# Patient Record
Sex: Male | Born: 1935 | Race: White | Hispanic: No | Marital: Married | Smoking: Former smoker
Health system: Southern US, Community
[De-identification: ages and names within clinical notes are randomized; demographics above are authoritative.]

## PROBLEM LIST (undated history)

## (undated) DIAGNOSIS — Z87442 Personal history of urinary calculi: Secondary | ICD-10-CM

## (undated) DIAGNOSIS — I2510 Atherosclerotic heart disease of native coronary artery without angina pectoris: Secondary | ICD-10-CM

## (undated) DIAGNOSIS — M5126 Other intervertebral disc displacement, lumbar region: Secondary | ICD-10-CM

## (undated) DIAGNOSIS — K649 Unspecified hemorrhoids: Secondary | ICD-10-CM

## (undated) DIAGNOSIS — M48062 Spinal stenosis, lumbar region with neurogenic claudication: Secondary | ICD-10-CM

## (undated) DIAGNOSIS — K219 Gastro-esophageal reflux disease without esophagitis: Secondary | ICD-10-CM

## (undated) DIAGNOSIS — C61 Malignant neoplasm of prostate: Secondary | ICD-10-CM

## (undated) DIAGNOSIS — M5136 Other intervertebral disc degeneration, lumbar region: Secondary | ICD-10-CM

## (undated) DIAGNOSIS — M5416 Radiculopathy, lumbar region: Secondary | ICD-10-CM

## (undated) DIAGNOSIS — M51379 Other intervertebral disc degeneration, lumbosacral region without mention of lumbar back pain or lower extremity pain: Secondary | ICD-10-CM

## (undated) DIAGNOSIS — I10 Essential (primary) hypertension: Secondary | ICD-10-CM

## (undated) DIAGNOSIS — M5137 Other intervertebral disc degeneration, lumbosacral region: Secondary | ICD-10-CM

## (undated) DIAGNOSIS — M1990 Unspecified osteoarthritis, unspecified site: Secondary | ICD-10-CM

## (undated) DIAGNOSIS — C801 Malignant (primary) neoplasm, unspecified: Secondary | ICD-10-CM

## (undated) DIAGNOSIS — E119 Type 2 diabetes mellitus without complications: Secondary | ICD-10-CM

## (undated) DIAGNOSIS — M51369 Other intervertebral disc degeneration, lumbar region without mention of lumbar back pain or lower extremity pain: Secondary | ICD-10-CM

## (undated) DIAGNOSIS — E785 Hyperlipidemia, unspecified: Secondary | ICD-10-CM

## (undated) DIAGNOSIS — J329 Chronic sinusitis, unspecified: Secondary | ICD-10-CM

## (undated) DIAGNOSIS — J449 Chronic obstructive pulmonary disease, unspecified: Secondary | ICD-10-CM

## (undated) DIAGNOSIS — N183 Chronic kidney disease, stage 3 unspecified: Secondary | ICD-10-CM

## (undated) HISTORY — PX: LEG SURGERY: SHX1003

## (undated) HISTORY — PX: CARDIAC CATHETERIZATION: SHX172

## (undated) HISTORY — PX: COLONOSCOPY: SHX174

## (undated) HISTORY — PX: FUNCTIONAL ENDOSCOPIC SINUS SURGERY: SUR616

## (undated) HISTORY — PX: FEMUR FRACTURE SURGERY: SHX633

## (undated) HISTORY — DX: Malignant neoplasm of prostate: C61

---

## 2005-09-23 ENCOUNTER — Ambulatory Visit: Payer: Self-pay | Admitting: Urology

## 2005-09-28 ENCOUNTER — Ambulatory Visit: Payer: Self-pay | Admitting: Urology

## 2005-11-03 ENCOUNTER — Ambulatory Visit: Payer: Self-pay | Admitting: Urology

## 2005-11-03 ENCOUNTER — Other Ambulatory Visit: Payer: Self-pay

## 2005-11-10 ENCOUNTER — Ambulatory Visit: Payer: Self-pay | Admitting: Urology

## 2005-12-14 HISTORY — PX: PROSTATE CRYOABLATION: SUR358

## 2007-12-29 ENCOUNTER — Ambulatory Visit: Payer: Self-pay | Admitting: Physician Assistant

## 2008-01-31 ENCOUNTER — Ambulatory Visit: Payer: Self-pay | Admitting: Unknown Physician Specialty

## 2008-04-11 ENCOUNTER — Emergency Department: Payer: Self-pay | Admitting: Emergency Medicine

## 2008-04-23 ENCOUNTER — Ambulatory Visit: Payer: Self-pay | Admitting: Urology

## 2009-06-20 ENCOUNTER — Ambulatory Visit: Payer: Self-pay

## 2009-08-13 ENCOUNTER — Ambulatory Visit: Payer: Self-pay | Admitting: Pain Medicine

## 2009-08-27 ENCOUNTER — Ambulatory Visit: Payer: Self-pay | Admitting: Physician Assistant

## 2009-09-10 ENCOUNTER — Ambulatory Visit: Payer: Self-pay | Admitting: Pain Medicine

## 2009-09-24 ENCOUNTER — Ambulatory Visit: Payer: Self-pay | Admitting: Pain Medicine

## 2009-10-15 ENCOUNTER — Ambulatory Visit: Payer: Self-pay | Admitting: Physician Assistant

## 2009-11-04 ENCOUNTER — Ambulatory Visit: Payer: Self-pay | Admitting: Urology

## 2010-01-02 ENCOUNTER — Emergency Department: Payer: Self-pay | Admitting: Emergency Medicine

## 2010-12-10 ENCOUNTER — Ambulatory Visit: Payer: Self-pay | Admitting: Urology

## 2012-10-23 ENCOUNTER — Emergency Department: Payer: Self-pay | Admitting: Emergency Medicine

## 2012-10-24 LAB — BASIC METABOLIC PANEL
Anion Gap: 11 (ref 7–16)
BUN: 33 mg/dL — ABNORMAL HIGH (ref 7–18)
Chloride: 105 mmol/L (ref 98–107)
Creatinine: 1.26 mg/dL (ref 0.60–1.30)
EGFR (Non-African Amer.): 56 — ABNORMAL LOW
Glucose: 95 mg/dL (ref 65–99)
Osmolality: 283 (ref 275–301)
Sodium: 138 mmol/L (ref 136–145)

## 2012-12-12 DIAGNOSIS — C61 Malignant neoplasm of prostate: Secondary | ICD-10-CM | POA: Insufficient documentation

## 2012-12-12 DIAGNOSIS — M5430 Sciatica, unspecified side: Secondary | ICD-10-CM | POA: Insufficient documentation

## 2012-12-12 DIAGNOSIS — N509 Disorder of male genital organs, unspecified: Secondary | ICD-10-CM | POA: Insufficient documentation

## 2012-12-12 DIAGNOSIS — N4340 Spermatocele of epididymis, unspecified: Secondary | ICD-10-CM | POA: Insufficient documentation

## 2012-12-12 DIAGNOSIS — N200 Calculus of kidney: Secondary | ICD-10-CM | POA: Insufficient documentation

## 2012-12-28 ENCOUNTER — Ambulatory Visit: Payer: Self-pay | Admitting: Urology

## 2013-07-28 ENCOUNTER — Ambulatory Visit: Payer: Self-pay | Admitting: Unknown Physician Specialty

## 2013-08-04 DIAGNOSIS — N320 Bladder-neck obstruction: Secondary | ICD-10-CM | POA: Insufficient documentation

## 2014-02-14 ENCOUNTER — Ambulatory Visit: Payer: Self-pay | Admitting: Urology

## 2014-06-25 DIAGNOSIS — M545 Low back pain, unspecified: Secondary | ICD-10-CM | POA: Insufficient documentation

## 2014-06-25 DIAGNOSIS — M5136 Other intervertebral disc degeneration, lumbar region: Secondary | ICD-10-CM | POA: Insufficient documentation

## 2014-06-25 DIAGNOSIS — M5126 Other intervertebral disc displacement, lumbar region: Secondary | ICD-10-CM | POA: Insufficient documentation

## 2014-07-09 DIAGNOSIS — M5137 Other intervertebral disc degeneration, lumbosacral region: Secondary | ICD-10-CM | POA: Insufficient documentation

## 2014-07-24 DIAGNOSIS — I2510 Atherosclerotic heart disease of native coronary artery without angina pectoris: Secondary | ICD-10-CM | POA: Insufficient documentation

## 2014-11-01 DIAGNOSIS — J449 Chronic obstructive pulmonary disease, unspecified: Secondary | ICD-10-CM | POA: Insufficient documentation

## 2015-03-27 DIAGNOSIS — I10 Essential (primary) hypertension: Secondary | ICD-10-CM | POA: Insufficient documentation

## 2015-03-27 DIAGNOSIS — E782 Mixed hyperlipidemia: Secondary | ICD-10-CM | POA: Insufficient documentation

## 2015-03-28 ENCOUNTER — Ambulatory Visit
Admit: 2015-03-28 | Disposition: A | Discharge status: Home / Self Care | Payer: Self-pay | Attending: Otolaryngology | Admitting: Otolaryngology

## 2015-04-08 LAB — SURGICAL PATHOLOGY

## 2015-09-12 ENCOUNTER — Other Ambulatory Visit: Payer: Self-pay | Admitting: Physical Medicine and Rehabilitation

## 2015-09-12 DIAGNOSIS — M5416 Radiculopathy, lumbar region: Secondary | ICD-10-CM

## 2015-09-20 ENCOUNTER — Ambulatory Visit
Admission: RE | Admit: 2015-09-20 | Discharge: 2015-09-20 | Disposition: A | Discharge status: Home / Self Care | Payer: PPO | Source: Ambulatory Visit | Attending: Physical Medicine and Rehabilitation | Admitting: Physical Medicine and Rehabilitation

## 2015-09-20 DIAGNOSIS — M4806 Spinal stenosis, lumbar region: Secondary | ICD-10-CM | POA: Diagnosis not present

## 2015-09-20 DIAGNOSIS — M545 Low back pain: Secondary | ICD-10-CM | POA: Diagnosis present

## 2015-09-20 DIAGNOSIS — M5136 Other intervertebral disc degeneration, lumbar region: Secondary | ICD-10-CM | POA: Diagnosis not present

## 2015-09-20 DIAGNOSIS — M2578 Osteophyte, vertebrae: Secondary | ICD-10-CM | POA: Diagnosis not present

## 2015-09-20 DIAGNOSIS — M5416 Radiculopathy, lumbar region: Secondary | ICD-10-CM

## 2015-11-28 DIAGNOSIS — K219 Gastro-esophageal reflux disease without esophagitis: Secondary | ICD-10-CM | POA: Insufficient documentation

## 2016-01-06 DIAGNOSIS — M5416 Radiculopathy, lumbar region: Secondary | ICD-10-CM | POA: Diagnosis not present

## 2016-01-06 DIAGNOSIS — M5136 Other intervertebral disc degeneration, lumbar region: Secondary | ICD-10-CM | POA: Diagnosis not present

## 2016-01-06 DIAGNOSIS — M4806 Spinal stenosis, lumbar region: Secondary | ICD-10-CM | POA: Diagnosis not present

## 2016-01-24 DIAGNOSIS — M5136 Other intervertebral disc degeneration, lumbar region: Secondary | ICD-10-CM | POA: Diagnosis not present

## 2016-01-24 DIAGNOSIS — M5416 Radiculopathy, lumbar region: Secondary | ICD-10-CM | POA: Diagnosis not present

## 2016-01-24 DIAGNOSIS — M4806 Spinal stenosis, lumbar region: Secondary | ICD-10-CM | POA: Diagnosis not present

## 2016-03-09 DIAGNOSIS — M5416 Radiculopathy, lumbar region: Secondary | ICD-10-CM | POA: Diagnosis not present

## 2016-03-09 DIAGNOSIS — M5136 Other intervertebral disc degeneration, lumbar region: Secondary | ICD-10-CM | POA: Diagnosis not present

## 2016-03-09 DIAGNOSIS — M4806 Spinal stenosis, lumbar region: Secondary | ICD-10-CM | POA: Diagnosis not present

## 2016-03-18 DIAGNOSIS — R339 Retention of urine, unspecified: Secondary | ICD-10-CM | POA: Insufficient documentation

## 2016-03-19 DIAGNOSIS — N320 Bladder-neck obstruction: Secondary | ICD-10-CM | POA: Diagnosis not present

## 2016-03-19 DIAGNOSIS — C61 Malignant neoplasm of prostate: Secondary | ICD-10-CM | POA: Diagnosis not present

## 2016-03-19 DIAGNOSIS — R339 Retention of urine, unspecified: Secondary | ICD-10-CM | POA: Diagnosis not present

## 2016-03-19 DIAGNOSIS — N4340 Spermatocele of epididymis, unspecified: Secondary | ICD-10-CM | POA: Diagnosis not present

## 2016-03-26 DIAGNOSIS — K648 Other hemorrhoids: Secondary | ICD-10-CM | POA: Insufficient documentation

## 2016-04-14 DIAGNOSIS — M5416 Radiculopathy, lumbar region: Secondary | ICD-10-CM | POA: Diagnosis not present

## 2016-04-14 DIAGNOSIS — M4806 Spinal stenosis, lumbar region: Secondary | ICD-10-CM | POA: Diagnosis not present

## 2016-04-14 DIAGNOSIS — M5136 Other intervertebral disc degeneration, lumbar region: Secondary | ICD-10-CM | POA: Diagnosis not present

## 2016-04-22 DIAGNOSIS — I2510 Atherosclerotic heart disease of native coronary artery without angina pectoris: Secondary | ICD-10-CM | POA: Diagnosis not present

## 2016-04-22 DIAGNOSIS — K219 Gastro-esophageal reflux disease without esophagitis: Secondary | ICD-10-CM | POA: Diagnosis not present

## 2016-04-22 DIAGNOSIS — E782 Mixed hyperlipidemia: Secondary | ICD-10-CM | POA: Diagnosis not present

## 2016-04-22 DIAGNOSIS — I10 Essential (primary) hypertension: Secondary | ICD-10-CM | POA: Diagnosis not present

## 2016-04-22 DIAGNOSIS — E119 Type 2 diabetes mellitus without complications: Secondary | ICD-10-CM | POA: Diagnosis not present

## 2016-05-19 DIAGNOSIS — H353131 Nonexudative age-related macular degeneration, bilateral, early dry stage: Secondary | ICD-10-CM | POA: Diagnosis not present

## 2016-05-19 DIAGNOSIS — H2513 Age-related nuclear cataract, bilateral: Secondary | ICD-10-CM | POA: Diagnosis not present

## 2016-05-19 DIAGNOSIS — E119 Type 2 diabetes mellitus without complications: Secondary | ICD-10-CM | POA: Diagnosis not present

## 2016-05-21 DIAGNOSIS — I10 Essential (primary) hypertension: Secondary | ICD-10-CM | POA: Diagnosis not present

## 2016-05-21 DIAGNOSIS — Z125 Encounter for screening for malignant neoplasm of prostate: Secondary | ICD-10-CM | POA: Diagnosis not present

## 2016-05-25 DIAGNOSIS — I10 Essential (primary) hypertension: Secondary | ICD-10-CM | POA: Diagnosis not present

## 2016-05-25 DIAGNOSIS — E782 Mixed hyperlipidemia: Secondary | ICD-10-CM | POA: Diagnosis not present

## 2016-05-25 DIAGNOSIS — I2510 Atherosclerotic heart disease of native coronary artery without angina pectoris: Secondary | ICD-10-CM | POA: Diagnosis not present

## 2016-05-25 DIAGNOSIS — E119 Type 2 diabetes mellitus without complications: Secondary | ICD-10-CM | POA: Diagnosis not present

## 2016-05-25 DIAGNOSIS — K219 Gastro-esophageal reflux disease without esophagitis: Secondary | ICD-10-CM | POA: Diagnosis not present

## 2016-05-25 DIAGNOSIS — Z0000 Encounter for general adult medical examination without abnormal findings: Secondary | ICD-10-CM | POA: Diagnosis not present

## 2016-05-28 DIAGNOSIS — M5137 Other intervertebral disc degeneration, lumbosacral region: Secondary | ICD-10-CM | POA: Diagnosis not present

## 2016-05-28 DIAGNOSIS — I10 Essential (primary) hypertension: Secondary | ICD-10-CM | POA: Diagnosis not present

## 2016-05-28 DIAGNOSIS — N183 Chronic kidney disease, stage 3 (moderate): Secondary | ICD-10-CM

## 2016-05-28 DIAGNOSIS — E1122 Type 2 diabetes mellitus with diabetic chronic kidney disease: Secondary | ICD-10-CM | POA: Insufficient documentation

## 2016-05-28 DIAGNOSIS — K219 Gastro-esophageal reflux disease without esophagitis: Secondary | ICD-10-CM | POA: Diagnosis not present

## 2016-06-01 DIAGNOSIS — M5136 Other intervertebral disc degeneration, lumbar region: Secondary | ICD-10-CM | POA: Diagnosis not present

## 2016-06-01 DIAGNOSIS — M4806 Spinal stenosis, lumbar region: Secondary | ICD-10-CM | POA: Diagnosis not present

## 2016-06-01 DIAGNOSIS — M5416 Radiculopathy, lumbar region: Secondary | ICD-10-CM | POA: Diagnosis not present

## 2016-06-26 DIAGNOSIS — M5136 Other intervertebral disc degeneration, lumbar region: Secondary | ICD-10-CM | POA: Diagnosis not present

## 2016-06-26 DIAGNOSIS — M5416 Radiculopathy, lumbar region: Secondary | ICD-10-CM | POA: Diagnosis not present

## 2016-06-26 DIAGNOSIS — M4806 Spinal stenosis, lumbar region: Secondary | ICD-10-CM | POA: Diagnosis not present

## 2016-08-24 DIAGNOSIS — E1122 Type 2 diabetes mellitus with diabetic chronic kidney disease: Secondary | ICD-10-CM | POA: Diagnosis not present

## 2016-08-24 DIAGNOSIS — K219 Gastro-esophageal reflux disease without esophagitis: Secondary | ICD-10-CM | POA: Diagnosis not present

## 2016-08-24 DIAGNOSIS — I2510 Atherosclerotic heart disease of native coronary artery without angina pectoris: Secondary | ICD-10-CM | POA: Diagnosis not present

## 2016-08-24 DIAGNOSIS — R001 Bradycardia, unspecified: Secondary | ICD-10-CM | POA: Diagnosis not present

## 2016-08-24 DIAGNOSIS — E782 Mixed hyperlipidemia: Secondary | ICD-10-CM | POA: Diagnosis not present

## 2016-08-24 DIAGNOSIS — N183 Chronic kidney disease, stage 3 (moderate): Secondary | ICD-10-CM | POA: Diagnosis not present

## 2016-08-24 DIAGNOSIS — I10 Essential (primary) hypertension: Secondary | ICD-10-CM | POA: Diagnosis not present

## 2016-08-26 DIAGNOSIS — N183 Chronic kidney disease, stage 3 (moderate): Secondary | ICD-10-CM | POA: Diagnosis not present

## 2016-08-26 DIAGNOSIS — E1122 Type 2 diabetes mellitus with diabetic chronic kidney disease: Secondary | ICD-10-CM | POA: Diagnosis not present

## 2016-08-27 DIAGNOSIS — M4806 Spinal stenosis, lumbar region: Secondary | ICD-10-CM | POA: Diagnosis not present

## 2016-08-27 DIAGNOSIS — M5416 Radiculopathy, lumbar region: Secondary | ICD-10-CM | POA: Diagnosis not present

## 2016-08-27 DIAGNOSIS — M5136 Other intervertebral disc degeneration, lumbar region: Secondary | ICD-10-CM | POA: Diagnosis not present

## 2016-09-01 DIAGNOSIS — E1122 Type 2 diabetes mellitus with diabetic chronic kidney disease: Secondary | ICD-10-CM | POA: Diagnosis not present

## 2016-09-01 DIAGNOSIS — K219 Gastro-esophageal reflux disease without esophagitis: Secondary | ICD-10-CM | POA: Diagnosis not present

## 2016-09-01 DIAGNOSIS — Z23 Encounter for immunization: Secondary | ICD-10-CM | POA: Diagnosis not present

## 2016-09-01 DIAGNOSIS — N183 Chronic kidney disease, stage 3 (moderate): Secondary | ICD-10-CM | POA: Diagnosis not present

## 2016-09-01 DIAGNOSIS — I10 Essential (primary) hypertension: Secondary | ICD-10-CM | POA: Diagnosis not present

## 2016-09-01 DIAGNOSIS — E782 Mixed hyperlipidemia: Secondary | ICD-10-CM | POA: Diagnosis not present

## 2016-09-21 DIAGNOSIS — E782 Mixed hyperlipidemia: Secondary | ICD-10-CM | POA: Diagnosis not present

## 2016-09-21 DIAGNOSIS — E1122 Type 2 diabetes mellitus with diabetic chronic kidney disease: Secondary | ICD-10-CM | POA: Diagnosis not present

## 2016-09-21 DIAGNOSIS — R5383 Other fatigue: Secondary | ICD-10-CM | POA: Diagnosis not present

## 2016-09-21 DIAGNOSIS — N183 Chronic kidney disease, stage 3 (moderate): Secondary | ICD-10-CM | POA: Diagnosis not present

## 2016-09-21 DIAGNOSIS — I10 Essential (primary) hypertension: Secondary | ICD-10-CM | POA: Diagnosis not present

## 2016-09-21 DIAGNOSIS — I2510 Atherosclerotic heart disease of native coronary artery without angina pectoris: Secondary | ICD-10-CM | POA: Diagnosis not present

## 2016-09-21 DIAGNOSIS — K219 Gastro-esophageal reflux disease without esophagitis: Secondary | ICD-10-CM | POA: Diagnosis not present

## 2016-10-07 DIAGNOSIS — M5136 Other intervertebral disc degeneration, lumbar region: Secondary | ICD-10-CM | POA: Diagnosis not present

## 2016-10-07 DIAGNOSIS — M48062 Spinal stenosis, lumbar region with neurogenic claudication: Secondary | ICD-10-CM | POA: Diagnosis not present

## 2016-10-07 DIAGNOSIS — M5416 Radiculopathy, lumbar region: Secondary | ICD-10-CM | POA: Diagnosis not present

## 2016-10-27 DIAGNOSIS — L578 Other skin changes due to chronic exposure to nonionizing radiation: Secondary | ICD-10-CM | POA: Diagnosis not present

## 2016-10-27 DIAGNOSIS — L814 Other melanin hyperpigmentation: Secondary | ICD-10-CM | POA: Diagnosis not present

## 2016-10-27 DIAGNOSIS — D1800 Hemangioma unspecified site: Secondary | ICD-10-CM | POA: Diagnosis not present

## 2016-10-27 DIAGNOSIS — D229 Melanocytic nevi, unspecified: Secondary | ICD-10-CM | POA: Diagnosis not present

## 2016-10-27 DIAGNOSIS — L821 Other seborrheic keratosis: Secondary | ICD-10-CM | POA: Diagnosis not present

## 2016-10-27 DIAGNOSIS — L820 Inflamed seborrheic keratosis: Secondary | ICD-10-CM | POA: Diagnosis not present

## 2016-10-27 DIAGNOSIS — Z1283 Encounter for screening for malignant neoplasm of skin: Secondary | ICD-10-CM | POA: Diagnosis not present

## 2016-10-27 DIAGNOSIS — L739 Follicular disorder, unspecified: Secondary | ICD-10-CM | POA: Diagnosis not present

## 2016-11-18 DIAGNOSIS — H353131 Nonexudative age-related macular degeneration, bilateral, early dry stage: Secondary | ICD-10-CM | POA: Diagnosis not present

## 2016-11-18 DIAGNOSIS — E119 Type 2 diabetes mellitus without complications: Secondary | ICD-10-CM | POA: Diagnosis not present

## 2016-11-25 DIAGNOSIS — N183 Chronic kidney disease, stage 3 (moderate): Secondary | ICD-10-CM | POA: Diagnosis not present

## 2016-11-25 DIAGNOSIS — E1122 Type 2 diabetes mellitus with diabetic chronic kidney disease: Secondary | ICD-10-CM | POA: Diagnosis not present

## 2016-12-02 DIAGNOSIS — I10 Essential (primary) hypertension: Secondary | ICD-10-CM | POA: Diagnosis not present

## 2016-12-02 DIAGNOSIS — M5137 Other intervertebral disc degeneration, lumbosacral region: Secondary | ICD-10-CM | POA: Diagnosis not present

## 2016-12-02 DIAGNOSIS — K219 Gastro-esophageal reflux disease without esophagitis: Secondary | ICD-10-CM | POA: Diagnosis not present

## 2016-12-02 DIAGNOSIS — N183 Chronic kidney disease, stage 3 (moderate): Secondary | ICD-10-CM | POA: Diagnosis not present

## 2016-12-02 DIAGNOSIS — E1122 Type 2 diabetes mellitus with diabetic chronic kidney disease: Secondary | ICD-10-CM | POA: Diagnosis not present

## 2016-12-02 DIAGNOSIS — E782 Mixed hyperlipidemia: Secondary | ICD-10-CM | POA: Diagnosis not present

## 2016-12-24 DIAGNOSIS — N183 Chronic kidney disease, stage 3 (moderate): Secondary | ICD-10-CM | POA: Diagnosis not present

## 2016-12-24 DIAGNOSIS — I10 Essential (primary) hypertension: Secondary | ICD-10-CM | POA: Diagnosis not present

## 2016-12-24 DIAGNOSIS — J449 Chronic obstructive pulmonary disease, unspecified: Secondary | ICD-10-CM | POA: Diagnosis not present

## 2016-12-24 DIAGNOSIS — J069 Acute upper respiratory infection, unspecified: Secondary | ICD-10-CM | POA: Diagnosis not present

## 2016-12-24 DIAGNOSIS — E1122 Type 2 diabetes mellitus with diabetic chronic kidney disease: Secondary | ICD-10-CM | POA: Diagnosis not present

## 2016-12-25 DIAGNOSIS — M5417 Radiculopathy, lumbosacral region: Secondary | ICD-10-CM | POA: Diagnosis not present

## 2016-12-25 DIAGNOSIS — M5416 Radiculopathy, lumbar region: Secondary | ICD-10-CM | POA: Diagnosis not present

## 2016-12-25 DIAGNOSIS — M48061 Spinal stenosis, lumbar region without neurogenic claudication: Secondary | ICD-10-CM | POA: Diagnosis not present

## 2016-12-25 DIAGNOSIS — M4807 Spinal stenosis, lumbosacral region: Secondary | ICD-10-CM | POA: Diagnosis not present

## 2017-01-05 DIAGNOSIS — H2513 Age-related nuclear cataract, bilateral: Secondary | ICD-10-CM | POA: Diagnosis not present

## 2017-01-06 DIAGNOSIS — J328 Other chronic sinusitis: Secondary | ICD-10-CM | POA: Diagnosis not present

## 2017-01-06 DIAGNOSIS — J330 Polyp of nasal cavity: Secondary | ICD-10-CM | POA: Diagnosis not present

## 2017-01-14 ENCOUNTER — Other Ambulatory Visit: Payer: Self-pay | Admitting: Neurosurgery

## 2017-02-23 NOTE — Pre-Procedure Instructions (Addendum)
Steven Wolfe  02/23/2017      [facility] - [place], [place] - [place] [address] [place] [address] Phone: [phone] Fax: [phone]    Your procedure is scheduled on Thurs. Mar. 22 @ 945.  Report to [facility] Admitting at 745 A.M.  Call this number if you have problems the morning of surgery:  [phone]   Remember:  Do not eat food or drink liquids after midnight.  Take these medicines the morning of surgery with A SIP OF WATER amolodipine (norvasc), omeprazole (prilosec), tramadol (ultram), tetrahydrozolone eye drops.   Do not wear jewelry, make-up or nail polish.  Do not wear lotions, powders, or colognes, or deoderant.  Do not shave 48 hours prior to surgery.  Men may shave face and neck.  Do not bring valuables to the hospital.  [facility] is not responsible for any belongings or valuables.  Contacts, dentures or bridgework may not be worn into surgery.  Leave your suitcase in the car.  After surgery it may be brought to your room.  For patients admitted to the hospital, discharge time will be determined by your treatment team.  Patients discharged the day of surgery will not be allowed to drive home.   Name and phone number of your driver:   Special instructions:     How to Manage Your Diabetes Before and After Surgery  Why is it important to control my blood sugar before and after surgery? . Improving blood sugar levels before and after surgery helps healing and can limit problems. . A way of improving blood sugar control is eating a healthy diet by: o  Eating less sugar and carbohydrates o  Increasing activity/exercise o  Talking with your doctor about reaching your blood sugar goals . High blood sugars (greater than 180 mg/dL) can raise your risk of infections and slow your recovery, so you will need to focus on controlling your diabetes during the weeks before surgery. . Make sure that the doctor who takes care of  your diabetes knows about your planned surgery including the date and location.  How do I manage my blood sugar before surgery? . Check your blood sugar at least 4 times a day, starting 2 days before surgery, to make sure that the level is not too high or low. o Check your blood sugar the morning of your surgery when you wake up and every 2 hours until you get to the Short Stay unit. . If your blood sugar is less than 70 mg/dL, you will need to treat for low blood sugar: o Do not take insulin. o Treat a low blood sugar (less than 70 mg/dL) with  cup of clear juice (cranberry or apple), 4 glucose tablets, OR glucose gel. o Recheck blood sugar in 15 minutes after treatment (to make sure it is greater than 70 mg/dL). If your blood sugar is not greater than 70 mg/dL on recheck, call [phone] for further instructions. . Report your blood sugar to the short stay nurse when you get to Short Stay.  . If you are admitted to the hospital after surgery: o Your blood sugar will be checked by the staff and you will probably be given insulin after surgery (instead of oral diabetes medicines) to make sure you have good blood sugar levels. o The goal for blood sugar control after surgery is 80-180 mg/dL.      WHAT DO I DO  ABOUT MY DIABETES MEDICATION?   Marland Kitchen Do not take oral diabetes medicines (pills) the morning of surgery.   Reviewed and Endorsed by [facility] Patient Education Committee, August 2015  Please read over the following fact sheets that you were given. Pain Booklet, Coughing and Deep Breathing, MRSA Information and Surgical Site Infection Prevention

## 2017-02-24 ENCOUNTER — Encounter (HOSPITAL_COMMUNITY)
Admission: RE | Admit: 2017-02-24 | Discharge: 2017-02-24 | Disposition: A | Discharge status: Home / Self Care | Payer: PPO | Source: Ambulatory Visit | Attending: Neurosurgery | Admitting: Neurosurgery

## 2017-02-24 ENCOUNTER — Encounter (HOSPITAL_COMMUNITY): Payer: Self-pay

## 2017-02-24 DIAGNOSIS — M48062 Spinal stenosis, lumbar region with neurogenic claudication: Secondary | ICD-10-CM | POA: Diagnosis not present

## 2017-02-24 DIAGNOSIS — N183 Chronic kidney disease, stage 3 (moderate): Secondary | ICD-10-CM | POA: Insufficient documentation

## 2017-02-24 DIAGNOSIS — I2510 Atherosclerotic heart disease of native coronary artery without angina pectoris: Secondary | ICD-10-CM | POA: Insufficient documentation

## 2017-02-24 DIAGNOSIS — E1122 Type 2 diabetes mellitus with diabetic chronic kidney disease: Secondary | ICD-10-CM | POA: Insufficient documentation

## 2017-02-24 DIAGNOSIS — M5416 Radiculopathy, lumbar region: Secondary | ICD-10-CM | POA: Diagnosis not present

## 2017-02-24 DIAGNOSIS — I129 Hypertensive chronic kidney disease with stage 1 through stage 4 chronic kidney disease, or unspecified chronic kidney disease: Secondary | ICD-10-CM | POA: Diagnosis not present

## 2017-02-24 DIAGNOSIS — Z79899 Other long term (current) drug therapy: Secondary | ICD-10-CM | POA: Insufficient documentation

## 2017-02-24 DIAGNOSIS — C801 Malignant (primary) neoplasm, unspecified: Secondary | ICD-10-CM

## 2017-02-24 DIAGNOSIS — M4690 Unspecified inflammatory spondylopathy, site unspecified: Secondary | ICD-10-CM | POA: Diagnosis not present

## 2017-02-24 DIAGNOSIS — Z01818 Encounter for other preprocedural examination: Secondary | ICD-10-CM | POA: Diagnosis not present

## 2017-02-24 DIAGNOSIS — E119 Type 2 diabetes mellitus without complications: Secondary | ICD-10-CM | POA: Diagnosis not present

## 2017-02-24 DIAGNOSIS — Z7984 Long term (current) use of oral hypoglycemic drugs: Secondary | ICD-10-CM | POA: Insufficient documentation

## 2017-02-24 DIAGNOSIS — M4807 Spinal stenosis, lumbosacral region: Secondary | ICD-10-CM | POA: Diagnosis not present

## 2017-02-24 DIAGNOSIS — Z888 Allergy status to other drugs, medicaments and biological substances status: Secondary | ICD-10-CM | POA: Diagnosis not present

## 2017-02-24 DIAGNOSIS — Z87891 Personal history of nicotine dependence: Secondary | ICD-10-CM | POA: Diagnosis not present

## 2017-02-24 DIAGNOSIS — M48061 Spinal stenosis, lumbar region without neurogenic claudication: Secondary | ICD-10-CM | POA: Diagnosis not present

## 2017-02-24 DIAGNOSIS — M549 Dorsalgia, unspecified: Secondary | ICD-10-CM | POA: Diagnosis not present

## 2017-02-24 DIAGNOSIS — Z8546 Personal history of malignant neoplasm of prostate: Secondary | ICD-10-CM | POA: Diagnosis not present

## 2017-02-24 DIAGNOSIS — Z0183 Encounter for blood typing: Secondary | ICD-10-CM | POA: Diagnosis not present

## 2017-02-24 DIAGNOSIS — Z9049 Acquired absence of other specified parts of digestive tract: Secondary | ICD-10-CM | POA: Diagnosis not present

## 2017-02-24 DIAGNOSIS — Z01812 Encounter for preprocedural laboratory examination: Secondary | ICD-10-CM | POA: Diagnosis not present

## 2017-02-24 DIAGNOSIS — M5116 Intervertebral disc disorders with radiculopathy, lumbar region: Secondary | ICD-10-CM | POA: Diagnosis not present

## 2017-02-24 DIAGNOSIS — Z91030 Bee allergy status: Secondary | ICD-10-CM | POA: Diagnosis not present

## 2017-02-24 DIAGNOSIS — K219 Gastro-esophageal reflux disease without esophagitis: Secondary | ICD-10-CM | POA: Diagnosis not present

## 2017-02-24 DIAGNOSIS — E782 Mixed hyperlipidemia: Secondary | ICD-10-CM | POA: Diagnosis not present

## 2017-02-24 DIAGNOSIS — Z981 Arthrodesis status: Secondary | ICD-10-CM | POA: Diagnosis not present

## 2017-02-24 DIAGNOSIS — I10 Essential (primary) hypertension: Secondary | ICD-10-CM | POA: Diagnosis not present

## 2017-02-24 DIAGNOSIS — M5136 Other intervertebral disc degeneration, lumbar region: Secondary | ICD-10-CM | POA: Diagnosis not present

## 2017-02-24 HISTORY — DX: Atherosclerotic heart disease of native coronary artery without angina pectoris: I25.10

## 2017-02-24 HISTORY — DX: Personal history of urinary calculi: Z87.442

## 2017-02-24 HISTORY — PX: CHOLECYSTECTOMY: SHX55

## 2017-02-24 HISTORY — DX: Malignant (primary) neoplasm, unspecified: C80.1

## 2017-02-24 HISTORY — DX: Essential (primary) hypertension: I10

## 2017-02-24 HISTORY — DX: Type 2 diabetes mellitus without complications: E11.9

## 2017-02-24 HISTORY — DX: Unspecified osteoarthritis, unspecified site: M19.90

## 2017-02-24 LAB — HEMOGLOBIN A1C
Hgb A1c MFr Bld: 6.5 % — ABNORMAL HIGH (ref 4.8–5.6)
Mean Plasma Glucose: 140 mg/dL

## 2017-02-24 LAB — CBC
HEMATOCRIT: 38.9 % — AB (ref 39.0–52.0)
Hemoglobin: 13 g/dL (ref 13.0–17.0)
MCH: 30.1 pg (ref 26.0–34.0)
MCHC: 33.4 g/dL (ref 30.0–36.0)
MCV: 90 fL (ref 78.0–100.0)
PLATELETS: 251 10*3/uL (ref 150–400)
RBC: 4.32 MIL/uL (ref 4.22–5.81)
RDW: 12.4 % (ref 11.5–15.5)
WBC: 5.9 10*3/uL (ref 4.0–10.5)

## 2017-02-24 LAB — BASIC METABOLIC PANEL
Anion gap: 10 (ref 5–15)
BUN: 25 mg/dL — AB (ref 6–20)
CHLORIDE: 108 mmol/L (ref 101–111)
CO2: 24 mmol/L (ref 22–32)
CREATININE: 1.26 mg/dL — AB (ref 0.61–1.24)
Calcium: 9.2 mg/dL (ref 8.9–10.3)
GFR calc Af Amer: >60 mL/min (ref >60)
GFR calc non Af Amer: 53 mL/min — ABNORMAL LOW (ref >60)
Glucose, Bld: 146 mg/dL — ABNORMAL HIGH (ref 65–99)
POTASSIUM: 4.1 mmol/L (ref 3.5–5.1)
Sodium: 142 mmol/L (ref 135–145)

## 2017-02-24 LAB — TYPE AND SCREEN
ABO/RH(D): A POS
ANTIBODY SCREEN: NEGATIVE

## 2017-02-24 LAB — ABO/RH: ABO/RH(D): A POS

## 2017-02-24 LAB — SURGICAL PCR SCREEN
MRSA, PCR: NEGATIVE
Staphylococcus aureus: NEGATIVE

## 2017-02-24 LAB — GLUCOSE, CAPILLARY: GLUCOSE-CAPILLARY: 146 mg/dL — AB (ref 65–99)

## 2017-02-24 MED ORDER — CHLORHEXIDINE GLUCONATE CLOTH 2 % EX PADS: 6.0000 | MEDICATED_PAD | Freq: Once | CUTANEOUS | Status: DC

## 2017-02-24 NOTE — Progress Notes (Addendum)
PCP: Dr. Glendon Axe  Cardiologist: Dr. Serafina Royals  EKG: past denies  Stress Test: pt reports possibly 4-5 years ago @ Dr. Nehemiah Massed  ECHO: pt reports possibly 4-5 years ago  Cardiac Cath: pt reports 15 years ago  Chest X-ray: pt denies past year

## 2017-02-26 NOTE — Progress Notes (Addendum)
Anesthesia Chart Review:  Pt is a 81 year old male scheduled for L4-5, L5-S1 PLIF, posterior lateral arthrodesis, posterior segmental instrumentation, interbody prosthesis on 03/04/2017 with Newman Pies, M.D.  - PCP is Glendon Axe, MD (notes in care everywhere) - Cardiologist is Serafina Royals, MD; last office visit 09/21/16 with Etta Quill, NP (notes in care everywhere)  PMH includes: CAD (mild by cath 11/26/00), HTN, DM, CKD (stage 3), prostate cancer. Former smoker.  Medications include: Amlodipine, gemfibrozil, glimepiride, Prilosec, ramipril  Preoperative labs reviewed.  HbA1c 6.5, glucose 146.  EKG 02/24/17: Sinus rhythm with 1st degree A-V block with PACs in a pattern of bigeminy. RBBB. Appears stable when compared to EKG 05/25/16 from cardiologist's office.   Echo 01/19/12 (current notable clinic): 1. Normal LV systolic function. EF 50%. 2. Moderate mitral and tricuspid insufficiency.  Cardiac cath 11/26/00 [facility]): 1. LM: Normal 2. LAD: Mid 50% tubular 3. CX: Small distal CX, small OM 2 and OM 3, large OM1, large ramus intermediate. 4. RCA: Acute marginal small. RPL 1 small. RPL 3 large.  Cardiology note by Etta Quill, NP dated 05/25/16 notes that if pt were to need back surgery, he would need to have a stress echo for pre-op eval.  This has not been done.  I notified Nikki in Dr. Arnoldo Morale office pt would need cardiac clearance prior to surgery.   Willeen Cass, FNP-BC [facility]/Anesthesiology Phone: [phone] 02/26/2017 2:55 PM  Addendum:   Pt saw Etta Quill, NP with cardiology 03/02/17.  Underwent Exercise Treadmill Test. Ms. Samara Snide note states "He exercised for 3 minutes on a Bruce protocol with resting heart rate of 86 bpm going to 142 bpm, representing 100% of the maximal age-predicted heart rate, with no significant EKG changes noted. The test was stopped due to fatigue" and pt was cleared for surgery at low risk.   If no  changes, I anticipate pt can proceed with surgery as scheduled.   Willeen Cass, FNP-BC [facility]/Anesthesiology Phone: [phone] 03/03/2017 2:19 PM

## 2017-03-02 DIAGNOSIS — I10 Essential (primary) hypertension: Secondary | ICD-10-CM | POA: Diagnosis not present

## 2017-03-02 DIAGNOSIS — I2510 Atherosclerotic heart disease of native coronary artery without angina pectoris: Secondary | ICD-10-CM | POA: Diagnosis not present

## 2017-03-02 DIAGNOSIS — E782 Mixed hyperlipidemia: Secondary | ICD-10-CM | POA: Diagnosis not present

## 2017-03-02 DIAGNOSIS — E119 Type 2 diabetes mellitus without complications: Secondary | ICD-10-CM | POA: Diagnosis not present

## 2017-03-02 DIAGNOSIS — K219 Gastro-esophageal reflux disease without esophagitis: Secondary | ICD-10-CM | POA: Diagnosis not present

## 2017-03-04 ENCOUNTER — Inpatient Hospital Stay (HOSPITAL_COMMUNITY)
Admission: RE | Admit: 2017-03-04 | Discharge: 2017-03-06 | DRG: 455 | Disposition: A | Discharge status: Home / Self Care | Payer: PPO | Source: Ambulatory Visit | Attending: Neurosurgery | Admitting: Neurosurgery

## 2017-03-04 ENCOUNTER — Inpatient Hospital Stay (HOSPITAL_COMMUNITY): Payer: PPO | Admitting: Anesthesiology

## 2017-03-04 ENCOUNTER — Inpatient Hospital Stay (HOSPITAL_COMMUNITY): Payer: PPO

## 2017-03-04 ENCOUNTER — Inpatient Hospital Stay (HOSPITAL_COMMUNITY): Payer: PPO | Admitting: Vascular Surgery

## 2017-03-04 ENCOUNTER — Encounter (HOSPITAL_COMMUNITY): Payer: Self-pay | Admitting: Urology

## 2017-03-04 ENCOUNTER — Encounter (HOSPITAL_COMMUNITY)
Admission: RE | Disposition: A | Discharge status: Home / Self Care | Payer: Self-pay | Source: Ambulatory Visit | Attending: Neurosurgery

## 2017-03-04 DIAGNOSIS — M5116 Intervertebral disc disorders with radiculopathy, lumbar region: Secondary | ICD-10-CM | POA: Diagnosis not present

## 2017-03-04 DIAGNOSIS — M5136 Other intervertebral disc degeneration, lumbar region: Secondary | ICD-10-CM | POA: Diagnosis not present

## 2017-03-04 DIAGNOSIS — M48061 Spinal stenosis, lumbar region without neurogenic claudication: Secondary | ICD-10-CM | POA: Diagnosis not present

## 2017-03-04 DIAGNOSIS — M48062 Spinal stenosis, lumbar region with neurogenic claudication: Principal | ICD-10-CM | POA: Diagnosis present

## 2017-03-04 DIAGNOSIS — Z8546 Personal history of malignant neoplasm of prostate: Secondary | ICD-10-CM | POA: Diagnosis not present

## 2017-03-04 DIAGNOSIS — Z888 Allergy status to other drugs, medicaments and biological substances status: Secondary | ICD-10-CM | POA: Diagnosis not present

## 2017-03-04 DIAGNOSIS — I10 Essential (primary) hypertension: Secondary | ICD-10-CM | POA: Diagnosis not present

## 2017-03-04 DIAGNOSIS — M4807 Spinal stenosis, lumbosacral region: Secondary | ICD-10-CM | POA: Diagnosis not present

## 2017-03-04 DIAGNOSIS — M4690 Unspecified inflammatory spondylopathy, site unspecified: Secondary | ICD-10-CM | POA: Diagnosis not present

## 2017-03-04 DIAGNOSIS — M549 Dorsalgia, unspecified: Secondary | ICD-10-CM | POA: Diagnosis not present

## 2017-03-04 DIAGNOSIS — Z981 Arthrodesis status: Secondary | ICD-10-CM | POA: Diagnosis not present

## 2017-03-04 DIAGNOSIS — E119 Type 2 diabetes mellitus without complications: Secondary | ICD-10-CM | POA: Diagnosis not present

## 2017-03-04 DIAGNOSIS — Z419 Encounter for procedure for purposes other than remedying health state, unspecified: Secondary | ICD-10-CM

## 2017-03-04 DIAGNOSIS — M5416 Radiculopathy, lumbar region: Secondary | ICD-10-CM | POA: Diagnosis not present

## 2017-03-04 DIAGNOSIS — Z87891 Personal history of nicotine dependence: Secondary | ICD-10-CM | POA: Diagnosis not present

## 2017-03-04 DIAGNOSIS — I2510 Atherosclerotic heart disease of native coronary artery without angina pectoris: Secondary | ICD-10-CM | POA: Diagnosis not present

## 2017-03-04 DIAGNOSIS — Z9049 Acquired absence of other specified parts of digestive tract: Secondary | ICD-10-CM | POA: Diagnosis not present

## 2017-03-04 DIAGNOSIS — Z91030 Bee allergy status: Secondary | ICD-10-CM

## 2017-03-04 LAB — GLUCOSE, CAPILLARY
GLUCOSE-CAPILLARY: 129 mg/dL — AB (ref 65–99)
GLUCOSE-CAPILLARY: 130 mg/dL — AB (ref 65–99)
Glucose-Capillary: 122 mg/dL — ABNORMAL HIGH (ref 65–99)
Glucose-Capillary: 142 mg/dL — ABNORMAL HIGH (ref 65–99)

## 2017-03-04 SURGERY — POSTERIOR LUMBAR FUSION 2 LEVEL
Anesthesia: General | Site: Back

## 2017-03-04 MED ORDER — SODIUM CHLORIDE 0.9 % IV SOLN: 250.0000 mL | INTRAVENOUS | Status: DC

## 2017-03-04 MED ORDER — BISACODYL 10 MG RE SUPP: 10.0000 mg | Freq: Every day | RECTAL | Status: DC | PRN

## 2017-03-04 MED ORDER — THROMBIN 20000 UNITS EX SOLR
CUTANEOUS | Status: AC
  Filled 2017-03-04: qty 20000

## 2017-03-04 MED ORDER — MENTHOL 3 MG MT LOZG: 1.0000 | LOZENGE | OROMUCOSAL | Status: DC | PRN

## 2017-03-04 MED ORDER — LIDOCAINE HCL (CARDIAC) 20 MG/ML IV SOLN
INTRAVENOUS | Status: DC | PRN
  Administered 2017-03-04: 80 mg via INTRAVENOUS

## 2017-03-04 MED ORDER — THROMBIN 5000 UNITS EX SOLR
CUTANEOUS | Status: AC
  Filled 2017-03-04: qty 5000

## 2017-03-04 MED ORDER — SODIUM CHLORIDE 0.9% FLUSH: 3.0000 mL | Freq: Two times a day (BID) | INTRAVENOUS | Status: DC

## 2017-03-04 MED ORDER — MORPHINE SULFATE (PF) 4 MG/ML IV SOLN: 4.0000 mg | INTRAVENOUS | Status: DC | PRN

## 2017-03-04 MED ORDER — PANTOPRAZOLE SODIUM 40 MG PO TBEC
40.0000 mg | DELAYED_RELEASE_TABLET | Freq: Every day | ORAL | Status: DC
  Administered 2017-03-05: 40 mg via ORAL
  Filled 2017-03-04: qty 1

## 2017-03-04 MED ORDER — DOCUSATE SODIUM 100 MG PO CAPS
100.0000 mg | ORAL_CAPSULE | Freq: Two times a day (BID) | ORAL | Status: DC
  Administered 2017-03-04 – 2017-03-05 (×3): 100 mg via ORAL
  Filled 2017-03-04 (×3): qty 1

## 2017-03-04 MED ORDER — AMLODIPINE BESYLATE 5 MG PO TABS
5.0000 mg | ORAL_TABLET | Freq: Two times a day (BID) | ORAL | Status: DC
  Administered 2017-03-04 – 2017-03-05 (×3): 5 mg via ORAL
  Filled 2017-03-04 (×4): qty 1

## 2017-03-04 MED ORDER — ADULT MULTIVITAMIN W/MINERALS CH
1.0000 | ORAL_TABLET | Freq: Every day | ORAL | Status: DC
  Administered 2017-03-05: 1 via ORAL
  Filled 2017-03-04: qty 1

## 2017-03-04 MED ORDER — SODIUM CHLORIDE 0.9 % IR SOLN
Status: DC | PRN
  Administered 2017-03-04: 11:00:00

## 2017-03-04 MED ORDER — BUPIVACAINE LIPOSOME 1.3 % IJ SUSP
20.0000 mL | INTRAMUSCULAR | Status: DC
  Filled 2017-03-04: qty 20

## 2017-03-04 MED ORDER — PROPOFOL 10 MG/ML IV BOLUS
INTRAVENOUS | Status: AC
  Filled 2017-03-04: qty 20

## 2017-03-04 MED ORDER — HYDROMORPHONE HCL 1 MG/ML IJ SOLN
INTRAMUSCULAR | Status: AC
  Filled 2017-03-04: qty 0.5

## 2017-03-04 MED ORDER — ACETAMINOPHEN 650 MG RE SUPP: 650.0000 mg | RECTAL | Status: DC | PRN

## 2017-03-04 MED ORDER — ACETAMINOPHEN 325 MG PO TABS
650.0000 mg | ORAL_TABLET | ORAL | Status: DC | PRN
  Administered 2017-03-05: 650 mg via ORAL
  Filled 2017-03-04: qty 2

## 2017-03-04 MED ORDER — PHENYLEPHRINE HCL 10 MG/ML IJ SOLN
INTRAVENOUS | Status: DC | PRN
  Administered 2017-03-04: 20 ug/min via INTRAVENOUS

## 2017-03-04 MED ORDER — ROCURONIUM BROMIDE 100 MG/10ML IV SOLN
INTRAVENOUS | Status: DC | PRN
Start: 2017-03-04 — End: 2017-03-04
  Administered 2017-03-04: 50 mg via INTRAVENOUS

## 2017-03-04 MED ORDER — HYDROMORPHONE HCL 1 MG/ML IJ SOLN
INTRAMUSCULAR | Status: AC
  Administered 2017-03-04: 0.5 mg via INTRAVENOUS
  Filled 2017-03-04: qty 1

## 2017-03-04 MED ORDER — ONDANSETRON HCL 4 MG/2ML IJ SOLN
INTRAMUSCULAR | Status: DC | PRN
  Administered 2017-03-04: 4 mg via INTRAVENOUS

## 2017-03-04 MED ORDER — HYDROMORPHONE HCL 1 MG/ML IJ SOLN
0.2500 mg | INTRAMUSCULAR | Status: DC | PRN
  Administered 2017-03-04 (×4): 0.5 mg via INTRAVENOUS

## 2017-03-04 MED ORDER — CYCLOBENZAPRINE HCL 10 MG PO TABS
ORAL_TABLET | ORAL | Status: AC
  Filled 2017-03-04: qty 1

## 2017-03-04 MED ORDER — VANCOMYCIN HCL 1000 MG IV SOLR
INTRAVENOUS | Status: AC
Start: 2017-03-04 — End: 2017-03-04
  Filled 2017-03-04: qty 1000

## 2017-03-04 MED ORDER — GLYCOPYRROLATE 0.2 MG/ML IJ SOLN
INTRAMUSCULAR | Status: DC | PRN
  Administered 2017-03-04: 0.2 mg via INTRAVENOUS

## 2017-03-04 MED ORDER — ONDANSETRON HCL 4 MG PO TABS: 4.0000 mg | ORAL_TABLET | Freq: Four times a day (QID) | ORAL | Status: DC | PRN

## 2017-03-04 MED ORDER — INSULIN ASPART 100 UNIT/ML ~~LOC~~ SOLN: 0.0000 [IU] | Freq: Three times a day (TID) | SUBCUTANEOUS | Status: DC

## 2017-03-04 MED ORDER — GELATIN ABSORBABLE MT POWD
OROMUCOSAL | Status: DC | PRN
  Administered 2017-03-04: 12:00:00 via TOPICAL

## 2017-03-04 MED ORDER — TRAMADOL HCL 50 MG PO TABS
100.0000 mg | ORAL_TABLET | Freq: Four times a day (QID) | ORAL | Status: DC
  Administered 2017-03-04 – 2017-03-06 (×6): 100 mg via ORAL
  Filled 2017-03-04 (×7): qty 2

## 2017-03-04 MED ORDER — BACITRACIN ZINC 500 UNIT/GM EX OINT
TOPICAL_OINTMENT | CUTANEOUS | Status: AC
  Filled 2017-03-04: qty 28.35

## 2017-03-04 MED ORDER — PROPOFOL 10 MG/ML IV BOLUS
INTRAVENOUS | Status: DC | PRN
  Administered 2017-03-04: 150 mg via INTRAVENOUS

## 2017-03-04 MED ORDER — SODIUM CHLORIDE 0.9% FLUSH: 3.0000 mL | INTRAVENOUS | Status: DC | PRN

## 2017-03-04 MED ORDER — LACTATED RINGERS IV SOLN
INTRAVENOUS | Status: DC
  Administered 2017-03-04 (×3): via INTRAVENOUS

## 2017-03-04 MED ORDER — FENTANYL CITRATE (PF) 100 MCG/2ML IJ SOLN
INTRAMUSCULAR | Status: DC | PRN
  Administered 2017-03-04 (×4): 50 ug via INTRAVENOUS
  Administered 2017-03-04: 100 ug via INTRAVENOUS

## 2017-03-04 MED ORDER — BUPIVACAINE-EPINEPHRINE (PF) 0.5% -1:200000 IJ SOLN
INTRAMUSCULAR | Status: DC | PRN
  Administered 2017-03-04: 10 mL

## 2017-03-04 MED ORDER — OXYCODONE HCL 5 MG PO TABS
ORAL_TABLET | ORAL | Status: AC
  Filled 2017-03-04: qty 2

## 2017-03-04 MED ORDER — PHENYLEPHRINE HCL 10 MG/ML IJ SOLN
INTRAMUSCULAR | Status: DC | PRN
Start: 2017-03-04 — End: 2017-03-04
  Administered 2017-03-04: 160 ug via INTRAVENOUS
  Administered 2017-03-04 (×2): 80 ug via INTRAVENOUS

## 2017-03-04 MED ORDER — POLYVINYL ALCOHOL 1.4 % OP SOLN
1.0000 [drp] | Freq: Four times a day (QID) | OPHTHALMIC | Status: DC | PRN
  Filled 2017-03-04: qty 15

## 2017-03-04 MED ORDER — RAMIPRIL 10 MG PO CAPS
10.0000 mg | ORAL_CAPSULE | Freq: Two times a day (BID) | ORAL | Status: DC
  Administered 2017-03-04 – 2017-03-05 (×3): 10 mg via ORAL
  Filled 2017-03-04 (×4): qty 1

## 2017-03-04 MED ORDER — CEFAZOLIN SODIUM-DEXTROSE 2-4 GM/100ML-% IV SOLN
2.0000 g | Freq: Three times a day (TID) | INTRAVENOUS | Status: AC
  Administered 2017-03-04 – 2017-03-05 (×2): 2 g via INTRAVENOUS
  Filled 2017-03-04 (×2): qty 100

## 2017-03-04 MED ORDER — PHENOL 1.4 % MT LIQD: 1.0000 | OROMUCOSAL | Status: DC | PRN

## 2017-03-04 MED ORDER — BUPIVACAINE LIPOSOME 1.3 % IJ SUSP
INTRAMUSCULAR | Status: DC | PRN
  Administered 2017-03-04: 20 mL

## 2017-03-04 MED ORDER — FENTANYL CITRATE (PF) 100 MCG/2ML IJ SOLN
INTRAMUSCULAR | Status: AC
  Filled 2017-03-04: qty 2

## 2017-03-04 MED ORDER — BACITRACIN ZINC 500 UNIT/GM EX OINT
TOPICAL_OINTMENT | CUTANEOUS | Status: DC | PRN
  Administered 2017-03-04: 1 via TOPICAL

## 2017-03-04 MED ORDER — SUGAMMADEX SODIUM 200 MG/2ML IV SOLN
INTRAVENOUS | Status: DC | PRN
  Administered 2017-03-04: 150 mg via INTRAVENOUS

## 2017-03-04 MED ORDER — VANCOMYCIN HCL 1000 MG IV SOLR
INTRAVENOUS | Status: DC | PRN
  Administered 2017-03-04: 1000 mg via TOPICAL

## 2017-03-04 MED ORDER — GLIMEPIRIDE 1 MG PO TABS
0.5000 mg | ORAL_TABLET | Freq: Every day | ORAL | Status: DC
  Administered 2017-03-05: 0.5 mg via ORAL
  Filled 2017-03-04 (×2): qty 0.5

## 2017-03-04 MED ORDER — GEMFIBROZIL 600 MG PO TABS
600.0000 mg | ORAL_TABLET | Freq: Two times a day (BID) | ORAL | Status: DC
  Administered 2017-03-04 – 2017-03-05 (×3): 600 mg via ORAL
  Filled 2017-03-04 (×4): qty 1

## 2017-03-04 MED ORDER — HYDROMORPHONE HCL 1 MG/ML IJ SOLN
INTRAMUSCULAR | Status: AC
  Administered 2017-03-04: 0.5 mg via INTRAVENOUS
  Filled 2017-03-04: qty 0.5

## 2017-03-04 MED ORDER — CEFAZOLIN SODIUM-DEXTROSE 2-4 GM/100ML-% IV SOLN
2.0000 g | INTRAVENOUS | Status: AC
  Administered 2017-03-04: 2 g via INTRAVENOUS
  Filled 2017-03-04: qty 100

## 2017-03-04 MED ORDER — CYCLOBENZAPRINE HCL 10 MG PO TABS
10.0000 mg | ORAL_TABLET | Freq: Three times a day (TID) | ORAL | Status: DC | PRN
  Administered 2017-03-04 – 2017-03-06 (×4): 10 mg via ORAL
  Filled 2017-03-04 (×3): qty 1

## 2017-03-04 MED ORDER — EPHEDRINE SULFATE 50 MG/ML IJ SOLN
INTRAMUSCULAR | Status: DC | PRN
  Administered 2017-03-04 (×7): 5 mg via INTRAVENOUS

## 2017-03-04 MED ORDER — 0.9 % SODIUM CHLORIDE (POUR BTL) OPTIME
TOPICAL | Status: DC | PRN
  Administered 2017-03-04: 1000 mL

## 2017-03-04 MED ORDER — OXYCODONE HCL 5 MG PO TABS
5.0000 mg | ORAL_TABLET | ORAL | Status: DC | PRN
  Administered 2017-03-04 – 2017-03-06 (×8): 10 mg via ORAL
  Filled 2017-03-04 (×8): qty 2

## 2017-03-04 MED ORDER — BUPIVACAINE-EPINEPHRINE (PF) 0.5% -1:200000 IJ SOLN
INTRAMUSCULAR | Status: AC
  Filled 2017-03-04: qty 30

## 2017-03-04 MED ORDER — PROMETHAZINE HCL 25 MG/ML IJ SOLN: 6.2500 mg | INTRAMUSCULAR | Status: DC | PRN

## 2017-03-04 MED ORDER — THROMBIN 20000 UNITS EX SOLR
CUTANEOUS | Status: DC | PRN
  Administered 2017-03-04: 11:00:00 via TOPICAL

## 2017-03-04 MED ORDER — INSULIN ASPART 100 UNIT/ML ~~LOC~~ SOLN: 0.0000 [IU] | SUBCUTANEOUS | Status: DC

## 2017-03-04 MED ORDER — ONDANSETRON HCL 4 MG/2ML IJ SOLN: 4.0000 mg | Freq: Four times a day (QID) | INTRAMUSCULAR | Status: DC | PRN

## 2017-03-04 SURGICAL SUPPLY — 71 items
BAG DECANTER FOR FLEXI CONT (MISCELLANEOUS) ×3 IMPLANT
BENZOIN TINCTURE PRP APPL 2/3 (GAUZE/BANDAGES/DRESSINGS) ×3 IMPLANT
BLADE CLIPPER SURG (BLADE) IMPLANT
BUR MATCHSTICK NEURO 3.0 LAGG (BURR) ×3 IMPLANT
BUR PRECISION FLUTE 6.0 (BURR) ×3 IMPLANT
CANISTER SUCT 3000ML PPV (MISCELLANEOUS) ×3 IMPLANT
CAP REVERE LOCKING (Cap) ×18 IMPLANT
CARTRIDGE OIL MAESTRO DRILL (MISCELLANEOUS) ×1 IMPLANT
CLOSURE WOUND 1/2 X4 (GAUZE/BANDAGES/DRESSINGS) ×1
CONT SPEC 4OZ CLIKSEAL STRL BL (MISCELLANEOUS) ×3 IMPLANT
COVER BACK TABLE 60X90IN (DRAPES) ×3 IMPLANT
DIFFUSER DRILL AIR PNEUMATIC (MISCELLANEOUS) ×3 IMPLANT
DRAPE C-ARM 42X72 X-RAY (DRAPES) ×9 IMPLANT
DRAPE HALF SHEET 40X57 (DRAPES) ×3 IMPLANT
DRAPE LAPAROTOMY 100X72X124 (DRAPES) ×3 IMPLANT
DRAPE POUCH INSTRU U-SHP 10X18 (DRAPES) ×3 IMPLANT
DRAPE SURG 17X23 STRL (DRAPES) ×12 IMPLANT
ELECT BLADE 4.0 EZ CLEAN MEGAD (MISCELLANEOUS) ×3
ELECT REM PT RETURN 9FT ADLT (ELECTROSURGICAL) ×3
ELECTRODE BLDE 4.0 EZ CLN MEGD (MISCELLANEOUS) ×1 IMPLANT
ELECTRODE REM PT RTRN 9FT ADLT (ELECTROSURGICAL) ×1 IMPLANT
GAUZE SPONGE 4X4 12PLY STRL (GAUZE/BANDAGES/DRESSINGS) ×3 IMPLANT
GAUZE SPONGE 4X4 16PLY XRAY LF (GAUZE/BANDAGES/DRESSINGS) IMPLANT
GLOVE BIO SURGEON STRL SZ8 (GLOVE) ×9 IMPLANT
GLOVE BIO SURGEON STRL SZ8.5 (GLOVE) ×6 IMPLANT
GLOVE BIOGEL PI IND STRL 7.5 (GLOVE) ×1 IMPLANT
GLOVE BIOGEL PI IND STRL 8 (GLOVE) ×6 IMPLANT
GLOVE BIOGEL PI IND STRL 8.5 (GLOVE) ×1 IMPLANT
GLOVE BIOGEL PI INDICATOR 7.5 (GLOVE) ×2
GLOVE BIOGEL PI INDICATOR 8 (GLOVE) ×12
GLOVE BIOGEL PI INDICATOR 8.5 (GLOVE) ×2
GLOVE ECLIPSE 7.5 STRL STRAW (GLOVE) ×12 IMPLANT
GLOVE EXAM NITRILE LRG STRL (GLOVE) IMPLANT
GLOVE EXAM NITRILE XL STR (GLOVE) IMPLANT
GLOVE EXAM NITRILE XS STR PU (GLOVE) IMPLANT
GLOVE SS BIOGEL STRL SZ 7 (GLOVE) ×2 IMPLANT
GLOVE SUPERSENSE BIOGEL SZ 7 (GLOVE) ×4
GOWN STRL REUS W/ TWL LRG LVL3 (GOWN DISPOSABLE) ×1 IMPLANT
GOWN STRL REUS W/ TWL XL LVL3 (GOWN DISPOSABLE) ×3 IMPLANT
GOWN STRL REUS W/TWL 2XL LVL3 (GOWN DISPOSABLE) ×12 IMPLANT
GOWN STRL REUS W/TWL LRG LVL3 (GOWN DISPOSABLE) ×2
GOWN STRL REUS W/TWL XL LVL3 (GOWN DISPOSABLE) ×6
HEMOSTAT POWDER KIT SURGIFOAM (HEMOSTASIS) ×3 IMPLANT
KIT BASIN OR (CUSTOM PROCEDURE TRAY) ×3 IMPLANT
KIT ROOM TURNOVER OR (KITS) ×3 IMPLANT
NEEDLE HYPO 21X1.5 SAFETY (NEEDLE) ×3 IMPLANT
NEEDLE HYPO 22GX1.5 SAFETY (NEEDLE) ×3 IMPLANT
NS IRRIG 1000ML POUR BTL (IV SOLUTION) ×3 IMPLANT
OIL CARTRIDGE MAESTRO DRILL (MISCELLANEOUS) ×3
PACK LAMINECTOMY NEURO (CUSTOM PROCEDURE TRAY) ×3 IMPLANT
PAD ARMBOARD 7.5X6 YLW CONV (MISCELLANEOUS) ×9 IMPLANT
PATTIES SURGICAL .5 X1 (DISPOSABLE) IMPLANT
ROD REVERE 7.5MM (Rod) ×3 IMPLANT
ROD REVERE CURVED 65MM (Rod) ×3 IMPLANT
SCREW REVERE 6.35 6.5MMX45 (Screw) ×3 IMPLANT
SCREW REVERE 6.35 6.5X40MM (Screw) ×6 IMPLANT
SCREW REVERE 6.5X50MM (Screw) ×9 IMPLANT
SPACER SUSTAIN O SML 8X22 8MM (Spacer) ×12 IMPLANT
SPONGE LAP 4X18 X RAY DECT (DISPOSABLE) IMPLANT
SPONGE NEURO XRAY DETECT 1X3 (DISPOSABLE) IMPLANT
SPONGE SURGIFOAM ABS GEL 100 (HEMOSTASIS) ×3 IMPLANT
STRIP BIOACTIVE 20CC 25X100X8 (Miscellaneous) ×3 IMPLANT
STRIP CLOSURE SKIN 1/2X4 (GAUZE/BANDAGES/DRESSINGS) ×2 IMPLANT
SUT VIC AB 1 CT1 18XBRD ANBCTR (SUTURE) ×2 IMPLANT
SUT VIC AB 1 CT1 8-18 (SUTURE) ×4
SUT VIC AB 2-0 CP2 18 (SUTURE) ×6 IMPLANT
TAPE CLOTH SURG 4X10 WHT LF (GAUZE/BANDAGES/DRESSINGS) ×3 IMPLANT
TOWEL GREEN STERILE (TOWEL DISPOSABLE) ×3 IMPLANT
TOWEL GREEN STERILE FF (TOWEL DISPOSABLE) ×2 IMPLANT
TRAY FOLEY W/METER SILVER 16FR (SET/KITS/TRAYS/PACK) ×3 IMPLANT
WATER STERILE IRR 1000ML POUR (IV SOLUTION) ×3 IMPLANT

## 2017-03-04 NOTE — Transfer of Care (Signed)
Immediate Anesthesia Transfer of Care Note  Patient: Steven Wolfe  Procedure(s) Performed: Procedure(s): POSTERIOR LUMBAR INTERBODY FUSION, POSTERIOR LATERAL ARTHRODESIS,POSTERIOR SEGMENTAL INSTRUMENTATION, INTERBODY PROSTHESIS LUMBAR FOUR- LUMBAR FIVE, LUMBAR FIVE- SACRAL ONE (N/A)  Patient Location: PACU  Anesthesia Type:General  Level of Consciousness: awake, alert , oriented and patient cooperative  Airway & Oxygen Therapy: Patient Spontanous Breathing and Patient connected to nasal cannula oxygen  Post-op Assessment: Report given to RN and Post -op Vital signs reviewed and stable  Post vital signs: Reviewed and stable  Last Vitals:  Vitals:   03/04/17 0803  BP: 130/69  Pulse: 67  Resp: 18  Temp: 36.7 C    Last Pain:  Vitals:   03/04/17 0803  TempSrc: Oral  PainSc:          Complications: No apparent anesthesia complications

## 2017-03-04 NOTE — Anesthesia Preprocedure Evaluation (Addendum)
Anesthesia Evaluation  Patient identified by MRN, date of birth, ID band Patient awake    Reviewed: Allergy & Precautions, NPO status , Patient's Chart, lab work & pertinent test results  Airway Mallampati: II  TM Distance: >3 FB Neck ROM: Full    Dental  (+) Dental Advisory Given   Pulmonary former smoker,    breath sounds clear to auscultation       Cardiovascular hypertension, Pt. on medications + CAD   Rhythm:Regular Rate:Normal     Neuro/Psych negative neurological ROS     GI/Hepatic negative GI ROS, Neg liver ROS,   Endo/Other  diabetes, Type 2  Renal/GU negative Renal ROS     Musculoskeletal  (+) Arthritis ,   Abdominal   Peds  Hematology negative hematology ROS (+)   Anesthesia Other Findings   Reproductive/Obstetrics                             Lab Results  Component Value Date   WBC 5.9 02/24/2017   HGB 13.0 02/24/2017   HCT 38.9 (L) 02/24/2017   MCV 90.0 02/24/2017   PLT 251 02/24/2017   Lab Results  Component Value Date   CREATININE 1.26 (H) 02/24/2017   BUN 25 (H) 02/24/2017   NA 142 02/24/2017   K 4.1 02/24/2017   CL 108 02/24/2017   CO2 24 02/24/2017    Anesthesia Physical Anesthesia Plan  ASA: III  Anesthesia Plan: General   Post-op Pain Management:    Induction: Intravenous  Airway Management Planned: Oral ETT  Additional Equipment:   Intra-op Plan:   Post-operative Plan: Extubation in OR  Informed Consent: I have reviewed the patients History and Physical, chart, labs and discussed the procedure including the risks, benefits and alternatives for the proposed anesthesia with the patient or authorized representative who has indicated his/her understanding and acceptance.   Dental advisory given  Plan Discussed with: CRNA  Anesthesia Plan Comments:         Anesthesia Quick Evaluation

## 2017-03-04 NOTE — Anesthesia Procedure Notes (Signed)
Procedure Name: Intubation Date/Time: 03/04/2017 9:56 AM Performed by: Shirlyn Goltz Pre-anesthesia Checklist: Patient identified, Suction available, Emergency Drugs available and Patient being monitored Patient Re-evaluated:Patient Re-evaluated prior to inductionOxygen Delivery Method: Circle system utilized Preoxygenation: Pre-oxygenation with 100% oxygen Intubation Type: IV induction Ventilation: Mask ventilation without difficulty Laryngoscope Size: Mac and 4 Grade View: Grade II Tube type: Oral Tube size: 7.0 mm Number of attempts: 1 Airway Equipment and Method: Stylet Placement Confirmation: ETT inserted through vocal cords under direct vision,  positive ETCO2 and breath sounds checked- equal and bilateral Secured at: 22 cm Tube secured with: Tape Dental Injury: Teeth and Oropharynx as per pre-operative assessment

## 2017-03-04 NOTE — Op Note (Signed)
Brief history: The patient is a 81 year old white male who has complained of back, buttock, and leg pain consistent with neurogenic claudication. Bent and was worked up with a lumbar MRI and lumbar x-rays. This demonstrated disc degeneration, foraminal stenosis, etc. at L4-5 and L5-S1. I discussed the various treatment options with the patient. He has decided to proceed with surgery after weighing the risks, benefits, and alternatives.  Preoperative diagnosis: L4-5 and L5-S1 stenosis, Degenerative disc disease, spinal stenosis compressing  the L4, L5 and the S1 nerve roots; lumbago; lumbar radiculopathy  Postoperative diagnosis: The same  Procedure: Bilateral L4-5 and L5-S1 Laminotomy/foraminotomies to decompress the bilateral L4, L5 and S1 nerve roots(the work required to do this was in addition to the work required to do the posterior lumbar interbody fusion because of the patient's spinal stenosis, facet arthropathy. Etc. requiring a wide decompression of the nerve roots.); L4-5 and L5-S1 posterior lumbar interbody fusion with local morselized autograft bone and Kinnex graft extender; insertion of interbody prosthesis at L4-5 and L5-S1 bilaterally (globus peek  interbody prosthesis); posterior segmental instrumentation from L4 to S1 with globus titanium pedicle screws and rods; posterior lateral arthrodesis at L4-5 and L5-S1 with local morselized autograft bone and Kinnex bone graft extender.  Surgeon: Dr. Earle Gell  Asst.: Dr. Erline Levine  Anesthesia: Gen. endotracheal  Estimated blood loss: 400 mL  Drains: None  Complications: None  Description of procedure: The patient was brought to the operating room by the anesthesia team. General endotracheal anesthesia was induced. The patient was turned to the prone position on the Wilson frame. The patient's lumbosacral region was then prepared with Betadine scrub and Betadine solution. Sterile drapes were applied.  I then injected the area to  be incised with Marcaine with epinephrine solution. I then used the scalpel to make a linear midline incision over the L4-5, L5-S1 interspace. I then used electrocautery to perform a bilateral subperiosteal dissection exposing the spinous process and lamina of L4, L5 and the upper sacrum. We then obtained intraoperative radiograph to confirm our location. We then inserted the Verstrac retractor to provide exposure.  I began the decompression by using the high speed drill to perform laminotomies at L4-5 and L5-S1 bilaterally. We then used the Kerrison punches to widen the laminotomy and removed the ligamentum flavum at L4-5 and L5-S1 bilaterally. We used the Kerrison punches to remove the medial facets at L4-5 and L5-S1 bilaterally. We performed wide foraminotomies about the bilateral L4, L5 and S1 nerve roots completing the decompression.  We now turned our attention to the posterior lumbar interbody fusion. I used a scalpel to incise the intervertebral disc at L4-5 and L5-S1 bilaterally. I then performed a partial intervertebral discectomy at L4-5 and L5-S1 bilaterally using the pituitary forceps. We prepared the vertebral endplates at O9-6 and E9-B2 bilaterally for the fusion by removing the soft tissues with the curettes. We then used the trial spacers to pick the appropriate sized interbody prosthesis. We prefilled his prosthesis with a combination of local morselized autograft bone that we obtained during the decompression as well as Kinnex bone graft extender. We inserted the prefilled prosthesis into the interspace at L4-5 and L5-S1 bilaterally. There was a good snug fit of the prosthesis in the interspace. We then filled and the remainder of the intervertebral disc space with local morselized autograft bone and Kinnex. This completed the posterior lumbar interbody arthrodesis.  We now turned attention to the instrumentation. Under fluoroscopic guidance we cannulated the bilateral L4, L5 and S1  pedicles  with the bone probe. We then removed the bone probe. We then tapped the pedicle with a 5.5 millimeter tap. We then removed the tap. We probed inside the tapped pedicle with a ball probe to rule out cortical breaches. We then inserted a 6.5 x 40, 45 and 50 millimeter pedicle screw into the L4, L5 and S1 pedicles bilaterally under fluoroscopic guidance. We then palpated along the medial aspect of the pedicles to rule out cortical breaches. There were none. The nerve roots were not injured. We then connected the unilateral pedicle screws with a lordotic rod. We compressed the construct and secured the rod in place with the caps. We then tightened the caps appropriately. This completed the instrumentation from L4 to S1 bilaterally.  We now turned our attention to the posterior lateral arthrodesis at L4-5 and L5-S1 bilaterally. We used the high-speed drill to decorticate the remainder of the facets, pars, transverse process at L4-5 and L5-S1 bilaterally. We then applied a combination of local morselized autograft bone and Kinnex bone graft extender over these decorticated posterior lateral structures. This completed the posterior lateral arthrodesis.  We then obtained hemostasis using bipolar electrocautery. We irrigated the wound out with bacitracin solution. We inspected the thecal sac and nerve roots and noted they were well decompressed. We then removed the retractor. We placed vancomycin powder in the wound. We reapproximated patient's thoracolumbar fascia with interrupted #1 Vicryl suture. We reapproximated patient's subcutaneous tissue with interrupted 2-0 Vicryl suture. The reapproximated patient's skin with Steri-Strips and benzoin. The wound was then coated with bacitracin ointment. A sterile dressing was applied. The drapes were removed. The patient was subsequently returned to the supine position where they were extubated by the anesthesia team. He was then transported to the post anesthesia care unit in  stable condition. All sponge instrument and needle counts were reportedly correct at the end of this case.

## 2017-03-04 NOTE — H&P (Signed)
Subjective: The patient is an 81 -year-old white male who has complained of back and bilateral leg pain consistent with neurogenic claudication. He has failed medical management and was worked up with a lumbar MRI. This demonstrated he had disc degeneration, spinal stenosis, etc. most prominent at L4-5 and L5-S1. I discussed the situation with the patient and his wife. We have discussed the various treatment options. He has decided to proceed with an L4-5 and L5-S1 decompression, instrumentation, and fusion.  Past Medical History:  Diagnosis Date  . Arthritis   . Cancer ([place]) 02/24/2017   prostate  . Coronary artery disease   . Diabetes mellitus without complication ([place])   . History of kidney stones   . Hypertension     Past Surgical History:  Procedure Laterality Date  . CARDIAC CATHETERIZATION    . CHOLECYSTECTOMY  02/24/2017   20 years ago  . FEMUR FRACTURE SURGERY  50 years ago  . PROSTATE CRYOABLATION  2007    Allergies  Allergen Reactions  . Bee Venom Shortness Of Breath    Difficulty breathing   . Chlorthalidone Other (See Comments)    Unsure of reaction    Social History  Substance Use Topics  . Smoking status: Former Smoker    Types: Cigarettes    Quit date: 02/24/2002  . Smokeless tobacco: Current User    Types: Chew  . Alcohol use Yes     Comment: occasionally    History reviewed. No pertinent family history. Prior to Admission medications   Medication Sig Start Date End Date Taking? Authorizing Provider  amLODipine (NORVASC) 5 MG tablet Take 5 mg by mouth 2 (two) times daily. 12/02/16  Yes Historical Provider, MD  diclofenac (VOLTAREN) 75 MG EC tablet Take 75 mg by mouth daily. 02/26/15  Yes Historical Provider, MD  gemfibrozil (LOPID) 600 MG tablet Take 600 mg by mouth 2 (two) times daily.   Yes Historical Provider, MD  glimepiride (AMARYL) 1 MG tablet Take 0.5 mg by mouth daily as needed. For blood sugar greater than 140.   Yes Historical Provider, MD   Multiple Vitamin (MULTIVITAMIN WITH MINERALS) TABS tablet Take 1 tablet by mouth daily. Multivitamins for Senior 50+   Yes Historical Provider, MD  Multiple Vitamins-Minerals (PRESERVISION AREDS 2 PO) Take 1 tablet by mouth daily.   Yes Historical Provider, MD  naproxen sodium (ANAPROX) 220 MG tablet Take 220 mg by mouth 2 (two) times daily as needed (for pain not relieved by tramadol).   Yes Historical Provider, MD  omeprazole (PRILOSEC) 20 MG capsule Take 20 mg by mouth daily. 12/24/16  Yes Historical Provider, MD  ramipril (ALTACE) 10 MG capsule Take 10 mg by mouth 2 (two) times daily. 01/11/17  Yes Historical Provider, MD  tetrahydrozoline 0.05 % ophthalmic solution Place 1 drop into both eyes 2 (two) times daily as needed (for scratchy eyes).   Yes Historical Provider, MD  traMADol (ULTRAM) 50 MG tablet Take 50 mg by mouth 5 (five) times daily as needed. For back pain. 01/11/17  Yes Historical Provider, MD     Review of Systems  Positive ROS: As above  All other systems have been reviewed and were otherwise negative with the exception of those mentioned in the HPI and as above.  Objective: Vital signs in last 24 hours: Temp:  [98 F (36.7 C)] 98 F (36.7 C) (03/22 0803) Pulse Rate:  [67] 67 (03/22 0803) Resp:  [18] 18 (03/22 0803) BP: (130)/(69) 130/69 (03/22 0803) SpO2:  [97 %]  97 % (03/22 0803)  General Appearance: Alert Head: Normocephalic, without obvious abnormality, atraumatic Eyes: PERRL, conjunctiva/corneas clear, EOM's intact,    Ears: Normal  Throat: Normal  Neck: Supple, Back: unremarkable Lungs: Clear to auscultation bilaterally, respirations unlabored Heart: Regular rate and rhythm, no murmur, rub or gallop Abdomen: Soft, non-tender Extremities: Extremities normal, atraumatic, no cyanosis or edema Skin: unremarkable  NEUROLOGIC:   Mental status: alert and oriented,Motor Exam - grossly normal Sensory Exam - grossly normal Reflexes:  Coordination - grossly  normal Gait - grossly normal Balance - grossly normal Cranial Nerves: I: smell Not tested  II: visual acuity  OS: Normal  OD: Normal   II: visual fields Full to confrontation  II: pupils Equal, round, reactive to light  III,VII: ptosis None  III,IV,VI: extraocular muscles  Full ROM  V: mastication Normal  V: facial light touch sensation  Normal  V,VII: corneal reflex  Present  VII: facial muscle function - upper  Normal  VII: facial muscle function - lower Normal  VIII: hearing Not tested  IX: soft palate elevation  Normal  IX,X: gag reflex Present  XI: trapezius strength  5/5  XI: sternocleidomastoid strength 5/5  XI: neck flexion strength  5/5  XII: tongue strength  Normal    Data Review Lab Results  Component Value Date   WBC 5.9 02/24/2017   HGB 13.0 02/24/2017   HCT 38.9 (L) 02/24/2017   MCV 90.0 02/24/2017   PLT 251 02/24/2017   Lab Results  Component Value Date   NA 142 02/24/2017   K 4.1 02/24/2017   CL 108 02/24/2017   CO2 24 02/24/2017   BUN 25 (H) 02/24/2017   CREATININE 1.26 (H) 02/24/2017   GLUCOSE 146 (H) 02/24/2017   No results found for: INR, PROTIME  Assessment/Plan: L4-5 and L5-S1 degenerative disc disease, spinal stenosis, lumbar radiculopathy, neurogenic claudication, lumbago: I have discussed the situation with the patient and his wife. I have reviewed his imaging studies with him and pointed out the abnormalities. We have discussed the various treatment options including surgery. I have described in L4-5 and L5-S1 decompression, instrumentation, fusion. I have shown them surgical models. We have discussed the risks, benefits, alternatives, expected postoperative course, and likelihood of achieving our goal to surgery. I have answered all the patient's questions. He has decided to proceed with surgery.   Lugenia Assefa D 03/04/2017 9:27 AM

## 2017-03-04 NOTE — Anesthesia Postprocedure Evaluation (Signed)
Anesthesia Post Note  Patient: JAQUIS PICKLESIMER  Procedure(s) Performed: Procedure(s) (LRB): POSTERIOR LUMBAR INTERBODY FUSION, POSTERIOR LATERAL ARTHRODESIS,POSTERIOR SEGMENTAL INSTRUMENTATION, INTERBODY PROSTHESIS LUMBAR FOUR- LUMBAR FIVE, LUMBAR FIVE- SACRAL ONE (N/A)  Patient location during evaluation: PACU Anesthesia Type: General Level of consciousness: awake and alert Pain management: pain level controlled Vital Signs Assessment: post-procedure vital signs reviewed and stable Respiratory status: spontaneous breathing, nonlabored ventilation, respiratory function stable and patient connected to nasal cannula oxygen Cardiovascular status: blood pressure returned to baseline and stable Postop Assessment: no signs of nausea or vomiting Anesthetic complications: no       Last Vitals:  Vitals:   03/04/17 1545 03/04/17 1600  BP: 121/63 (!) 111/39  Pulse: 77 72  Resp: 10 15  Temp:      Last Pain:  Vitals:   03/04/17 0803  TempSrc: Oral  PainSc:                  Tiajuana Amass

## 2017-03-04 NOTE — Progress Notes (Signed)
Orthopedic Tech Progress Note Patient Details:  SIRIS HOOS 1936-02-18 [id number] Patient has brace Patient ID: Steven Wolfe, male   DOB: [birth year], 81 y.o.   MRN: [id number]   Braulio Bosch 03/04/2017, 5:26 PM

## 2017-03-05 LAB — CBC
HEMATOCRIT: 35 % — AB (ref 39.0–52.0)
HEMOGLOBIN: 11.7 g/dL — AB (ref 13.0–17.0)
MCH: 30.2 pg (ref 26.0–34.0)
MCHC: 33.4 g/dL (ref 30.0–36.0)
MCV: 90.2 fL (ref 78.0–100.0)
Platelets: 236 10*3/uL (ref 150–400)
RBC: 3.88 MIL/uL — ABNORMAL LOW (ref 4.22–5.81)
RDW: 12.8 % (ref 11.5–15.5)
WBC: 11.9 10*3/uL — ABNORMAL HIGH (ref 4.0–10.5)

## 2017-03-05 LAB — GLUCOSE, CAPILLARY
Glucose-Capillary: 176 mg/dL — ABNORMAL HIGH (ref 65–99)
Glucose-Capillary: 154 mg/dL — ABNORMAL HIGH (ref 65–99)
Glucose-Capillary: 126 mg/dL — ABNORMAL HIGH (ref 65–99)
Glucose-Capillary: 144 mg/dL — ABNORMAL HIGH (ref 65–99)

## 2017-03-05 LAB — BASIC METABOLIC PANEL
Anion gap: 9 (ref 5–15)
BUN: 19 mg/dL (ref 6–20)
CHLORIDE: 100 mmol/L — AB (ref 101–111)
CO2: 27 mmol/L (ref 22–32)
CREATININE: 1.26 mg/dL — AB (ref 0.61–1.24)
Calcium: 8.7 mg/dL — ABNORMAL LOW (ref 8.9–10.3)
GFR calc Af Amer: >60 mL/min (ref >60)
GFR calc non Af Amer: 53 mL/min — ABNORMAL LOW (ref >60)
Glucose, Bld: 155 mg/dL — ABNORMAL HIGH (ref 65–99)
Potassium: 4.3 mmol/L (ref 3.5–5.1)
Sodium: 136 mmol/L (ref 135–145)

## 2017-03-05 MED FILL — Sodium Chloride IV Soln 0.9%: INTRAVENOUS | Qty: 1000 | Status: AC

## 2017-03-05 MED FILL — Heparin Sodium (Porcine) Inj 1000 Unit/ML: INTRAMUSCULAR | Qty: 30 | Status: AC

## 2017-03-05 NOTE — Evaluation (Signed)
Physical Therapy Evaluation Patient Details Name: Steven Wolfe MRN: [id number] DOB: 08-24-1936 Today's Date: 03/05/2017   History of Present Illness  Pt s/p L4-S1 lumbar fusion. PMH - DM, HTN, prostate CA, CAD, arthritis  Clinical Impression  Pt doing well with mobility and no further PT needed.  Ready for dc from PT standpoint.      Follow Up Recommendations No PT follow up;Supervision - Intermittent    Equipment Recommendations  None recommended by PT    Recommendations for Other Services       Precautions / Restrictions Precautions Precautions: Back Precaution Booklet Issued: Yes (comment) Required Braces or Orthoses: Spinal Brace Spinal Brace: Lumbar corset;Applied in sitting position;Applied in standing position Restrictions Weight Bearing Restrictions: No      Mobility  Bed Mobility Overal bed mobility: Needs Assistance Bed Mobility: Rolling;Sidelying to Sit;Sit to Sidelying Rolling: Supervision Sidelying to sit: Min assist     Sit to sidelying: Min assist General bed mobility comments: Performed with bed flat and no rails. Very light assist to elevate trunk into sitting and to bring heels back up into bed returning to sidelying. Verbal clues for technique.  Transfers Overall transfer level: Needs assistance Equipment used: Rolling walker (2 wheeled);None Transfers: Sit to/from Stand Sit to Stand: Supervision         General transfer comment: Incr time and effort and supervision for safety.  Ambulation/Gait Ambulation/Gait assistance: Supervision;Modified independent (Device/Increase time) Ambulation Distance (Feet): 200 Feet Assistive device: Rolling walker (2 wheeled);None Gait Pattern/deviations: Step-through pattern;Decreased stride length Gait velocity: decr Gait velocity interpretation: Below normal speed for age/gender General Gait Details: Steady gait with walker and pt modified independent. Without assistive device pt required  supervision.  Stairs            Wheelchair Mobility    Modified Rankin (Stroke Patients Only)       Balance Overall balance assessment: No apparent balance deficits (not formally assessed)                                           Pertinent Vitals/Pain Pain Assessment: Faces Faces Pain Scale: Hurts little more Pain Location: back Pain Descriptors / Indicators: Grimacing;Operative site guarding Pain Intervention(s): Limited activity within patient's tolerance;Premedicated before session;Monitored during session;Repositioned    Home Living Family/patient expects to be discharged to:: Private residence Living Arrangements: Spouse/significant other Available Help at Discharge: Family;Available 24 hours/day Type of Home: House Home Access: Stairs to enter   [employer] of Steps: 1 Home Layout: One level Home Equipment: Walker - 2 wheels;Cane - single point      Prior Function Level of Independence: Independent with assistive device(s)         Comments: Uses cane at times when out for longer distances     Hand Dominance        Extremity/Trunk Assessment   Upper Extremity Assessment Upper Extremity Assessment: Overall WFL for tasks assessed    Lower Extremity Assessment Lower Extremity Assessment: Overall WFL for tasks assessed       Communication   Communication: No difficulties  Cognition Arousal/Alertness: Awake/alert Behavior During Therapy: WFL for tasks assessed/performed Overall Cognitive Status: Within Functional Limits for tasks assessed  General Comments      Exercises     Assessment/Plan    PT Assessment Patent does not need any further PT services  PT Problem List         PT Treatment Interventions      PT Goals (Current goals can be found in the Care Plan section)  Acute Rehab PT Goals PT Goal Formulation: All assessment and education  complete, DC therapy    Frequency     Barriers to discharge        Co-evaluation               End of Session Equipment Utilized During Treatment: Back brace Activity Tolerance: Patient tolerated treatment well Patient left: in bed;with call bell/phone within reach;with family/visitor present Nurse Communication: Mobility status PT Visit Diagnosis: Difficulty in walking, not elsewhere classified (R26.2);Pain Pain - part of body:  (back)    Time: 6168-3729 PT Time Calculation (min) (ACUTE ONLY): 21 min   Charges:   PT Evaluation $PT Eval Low Complexity: 1 Procedure     PT G CodesMarland Kitchen       [facility] PT [place] 03/05/2017, 11:28 AM

## 2017-03-05 NOTE — Progress Notes (Signed)
Patient ID: Steven Wolfe, male   DOB: 12/31/1935, 81 y.o.   MRN: [id number] Subjective:  The patient is alert and pleasant. His back is sore. He looks well. He wants to go home tomorrow.  Objective: Vital signs in last 24 hours: Temp:  [97.7 F (36.5 C)-99.5 F (37.5 C)] 99.5 F (37.5 C) (03/23 0821) Pulse Rate:  [72-100] 94 (03/23 0821) Resp:  [10-20] 18 (03/23 0821) BP: (111-148)/(39-72) 140/64 (03/23 0821) SpO2:  [93 %-100 %] 93 % (03/23 0821)  Intake/Output from previous day: 03/22 0701 - 03/23 0700 In: 2200 [I.V.:2200] Out: 1685 [Urine:1335; Blood:350] Intake/Output this shift: Total I/O In: 240 [P.O.:240] Out: -   Physical exam the patient is alert and pleasant. His strength is grossly normal in his lower extremities. His dressing has a blood stain.  Lab Results:  Recent Labs  03/05/17 0124  WBC 11.9*  HGB 11.7*  HCT 35.0*  PLT 236   BMET  Recent Labs  03/05/17 0124  NA 136  K 4.3  CL 100*  CO2 27  GLUCOSE 155*  BUN 19  CREATININE 1.26*  CALCIUM 8.7*    Studies/Results: Dg Lumbar Spine 2-3 Views  Result Date: 03/04/2017 CLINICAL DATA:  81 year old male undergoing lumbar spine surgery. EXAM: DG C-ARM 61-120 MIN; LUMBAR SPINE - 2-3 VIEW COMPARISON:  [place] [facility] lumbar radiographs 12/25/2016. [facility] lumbar MRI 09/20/2015. FLUOROSCOPY TIME:  0 minutes 35 seconds FINDINGS: 2 intraoperative fluoroscopic spot views of the lumbosacral junction demonstrate placement of L4-L5 and L5-S1 transpedicular and interbody fusion hardware. Posterior connecting rods are not yet in place. IMPRESSION: Posterior and interbody fusion underway at L4-L5 and L5-S1. Electronically Signed   By: Genevie Ann M.D.   On: 03/04/2017 14:29   Dg Lumbar Spine 1 View  Result Date: 03/04/2017 CLINICAL DATA:  L4-L5, L5-S1 fusion EXAM: LUMBAR SPINE - 1 VIEW COMPARISON:  12/25/2016 FINDINGS: Single lateral view of the lumbar spine submitted. Again noted  significant disc space flattening with vacuum disc phenomenon and anterior spurring at L4-L5 and L5-S1 level. There is posterior localization instrument at the level of L5-S1 disc space. IMPRESSION: Posterior localization instrument at the level of L5-S1 disc space. Electronically Signed   By: Lahoma Crocker M.D.   On: 03/04/2017 11:11   Dg C-arm 1-60 Min  Result Date: 03/04/2017 CLINICAL DATA:  81 year old male undergoing lumbar spine surgery. EXAM: DG C-ARM 61-120 MIN; LUMBAR SPINE - 2-3 VIEW COMPARISON:  [place] [facility] lumbar radiographs 12/25/2016. [facility] lumbar MRI 09/20/2015. FLUOROSCOPY TIME:  0 minutes 35 seconds FINDINGS: 2 intraoperative fluoroscopic spot views of the lumbosacral junction demonstrate placement of L4-L5 and L5-S1 transpedicular and interbody fusion hardware. Posterior connecting rods are not yet in place. IMPRESSION: Posterior and interbody fusion underway at L4-L5 and L5-S1. Electronically Signed   By: Genevie Ann M.D.   On: 03/04/2017 14:29    Assessment/Plan: Postop day #1: The patient is doing well. We will mobilize him with PT. He may go home tomorrow.  LOS: 1 day     Hanley Rispoli D 03/05/2017, 12:06 PM

## 2017-03-06 LAB — GLUCOSE, CAPILLARY: Glucose-Capillary: 121 mg/dL — ABNORMAL HIGH (ref 65–99)

## 2017-03-06 LAB — HEMOGLOBIN A1C
HEMOGLOBIN A1C: 6.8 % — AB (ref 4.8–5.6)
MEAN PLASMA GLUCOSE: 148 mg/dL

## 2017-03-06 MED ORDER — OXYCODONE HCL 5 MG PO TABS: 5.0000 mg | ORAL_TABLET | ORAL | 0 refills | Status: DC | PRN

## 2017-03-06 MED ORDER — CYCLOBENZAPRINE HCL 10 MG PO TABS: 10.0000 mg | ORAL_TABLET | Freq: Three times a day (TID) | ORAL | 1 refills | Status: DC | PRN

## 2017-03-06 MED ORDER — DOCUSATE SODIUM 100 MG PO CAPS: 100.0000 mg | ORAL_CAPSULE | Freq: Two times a day (BID) | ORAL | 0 refills | Status: AC

## 2017-03-06 NOTE — Discharge Summary (Signed)
Physician Discharge Summary  Patient ID: Steven Wolfe MRN: [id number] DOB/AGE: 1936-02-07 81 y.o.  Admit date: 03/04/2017 Discharge date: 03/06/2017  Admission Diagnoses:L4-5 and L5-S1 degenerative disc disease, stenosis, lumbago, lumbar radiculopathy, neurogenic claudication  Discharge Diagnoses: The same Active Problems:   Lumbar stenosis with neurogenic claudication   Discharged Condition: good  Hospital Course: I performed an L4-5 and L5-S1 decompression, instrumentation, and fusion on the patient on 03/04/2017. The surgery went well.  The patient's postoperative course was unremarkable. On postoperative day #2 the patient requested discharge to home. The patient, and his wife, were given written and oral discharge instructions. All their questions were answered.  Consults: Physical therapy Significant Diagnostic Studies: None Treatments: L4-5 and L5-S1 redo laminectomy, discectomy, decompression, instrumentation, and fusion. Discharge Exam: Blood pressure (!) 114/92, pulse (!) 104, temperature 98.3 F (36.8 C), temperature source Oral, resp. rate 18, SpO2 94 %. The patient is alert and pleasant. He looks well. His strength is normal in his lower extremities. His dressing has an old bloodstain.  Disposition: Home  Discharge Instructions    Call MD for:  difficulty breathing, headache or visual disturbances    Complete by:  As directed    Call MD for:  extreme fatigue    Complete by:  As directed    Call MD for:  hives    Complete by:  As directed    Call MD for:  persistant dizziness or light-headedness    Complete by:  As directed    Call MD for:  persistant nausea and vomiting    Complete by:  As directed    Call MD for:  redness, tenderness, or signs of infection (pain, swelling, redness, odor or green/yellow discharge around incision site)    Complete by:  As directed    Call MD for:  severe uncontrolled pain    Complete by:  As directed    Call MD for:   temperature >100.4    Complete by:  As directed    Diet - low sodium heart healthy    Complete by:  As directed    Discharge instructions    Complete by:  As directed    Call [phone] for a followup appointment. Take a stool softener while you are using pain medications.   Driving Restrictions    Complete by:  As directed    Do not drive for 2 weeks.   Increase activity slowly    Complete by:  As directed    Lifting restrictions    Complete by:  As directed    Do not lift more than 5 pounds. No excessive bending or twisting.   May shower / Bathe    Complete by:  As directed    He may shower after the pain she is removed 3 days after surgery. Leave the incision alone.   Remove dressing in 24 hours    Complete by:  As directed      Allergies as of 03/06/2017      Reactions   Bee Venom Shortness Of Breath   Difficulty breathing   Chlorthalidone Other (See Comments)   Unsure of reaction      Medication List    STOP taking these medications   diclofenac 75 MG EC tablet Commonly known as:  VOLTAREN   naproxen sodium 220 MG tablet Commonly known as:  ANAPROX     TAKE these medications   amLODipine 5 MG tablet Commonly known as:  NORVASC Take 5 mg by mouth 2 (  two) times daily.   cyclobenzaprine 10 MG tablet Commonly known as:  FLEXERIL Take 1 tablet (10 mg total) by mouth 3 (three) times daily as needed for muscle spasms.   docusate sodium 100 MG capsule Commonly known as:  COLACE Take 1 capsule (100 mg total) by mouth 2 (two) times daily.   gemfibrozil 600 MG tablet Commonly known as:  LOPID Take 600 mg by mouth 2 (two) times daily.   glimepiride 1 MG tablet Commonly known as:  AMARYL Take 0.5 mg by mouth daily as needed. For blood sugar greater than 140.   multivitamin with minerals Tabs tablet Take 1 tablet by mouth daily. Multivitamins for Senior 50+   omeprazole 20 MG capsule Commonly known as:  PRILOSEC Take 20 mg by mouth daily.   oxyCODONE 5 MG  immediate release tablet Commonly known as:  Oxy IR/ROXICODONE Take 1-2 tablets (5-10 mg total) by mouth every 3 (three) hours as needed for breakthrough pain.   PRESERVISION AREDS 2 PO Take 1 tablet by mouth daily.   ramipril 10 MG capsule Commonly known as:  ALTACE Take 10 mg by mouth 2 (two) times daily.   tetrahydrozoline 0.05 % ophthalmic solution Place 1 drop into both eyes 2 (two) times daily as needed (for scratchy eyes).   traMADol 50 MG tablet Commonly known as:  ULTRAM Take 50 mg by mouth 5 (five) times daily as needed. For back pain.        SignedOphelia Charter 03/06/2017, 9:44 AM

## 2017-03-06 NOTE — Progress Notes (Signed)
Pt doing well. Pt and wife given D/C instructions with Rx's, verbal understanding was provided. Pt's IV was removed prior to D/C. Pt's incision is changed per MD order. Pt D/C'd home via wheelchair with wife per MD order. Pt is stable @ D/C and has no other needs at this time. Holli Humbles, RN

## 2017-03-06 NOTE — Discharge Instructions (Signed)

## 2017-03-30 DIAGNOSIS — M4807 Spinal stenosis, lumbosacral region: Secondary | ICD-10-CM | POA: Diagnosis not present

## 2017-05-03 DIAGNOSIS — K219 Gastro-esophageal reflux disease without esophagitis: Secondary | ICD-10-CM | POA: Diagnosis not present

## 2017-05-03 DIAGNOSIS — E119 Type 2 diabetes mellitus without complications: Secondary | ICD-10-CM | POA: Diagnosis not present

## 2017-05-03 DIAGNOSIS — E1122 Type 2 diabetes mellitus with diabetic chronic kidney disease: Secondary | ICD-10-CM | POA: Diagnosis not present

## 2017-05-03 DIAGNOSIS — I2510 Atherosclerotic heart disease of native coronary artery without angina pectoris: Secondary | ICD-10-CM | POA: Diagnosis not present

## 2017-05-03 DIAGNOSIS — N183 Chronic kidney disease, stage 3 (moderate): Secondary | ICD-10-CM | POA: Diagnosis not present

## 2017-05-03 DIAGNOSIS — I10 Essential (primary) hypertension: Secondary | ICD-10-CM | POA: Diagnosis not present

## 2017-05-03 DIAGNOSIS — E782 Mixed hyperlipidemia: Secondary | ICD-10-CM | POA: Diagnosis not present

## 2017-05-07 DIAGNOSIS — N183 Chronic kidney disease, stage 3 (moderate): Secondary | ICD-10-CM | POA: Diagnosis not present

## 2017-05-07 DIAGNOSIS — E782 Mixed hyperlipidemia: Secondary | ICD-10-CM | POA: Diagnosis not present

## 2017-05-07 DIAGNOSIS — I10 Essential (primary) hypertension: Secondary | ICD-10-CM | POA: Diagnosis not present

## 2017-05-07 DIAGNOSIS — E1122 Type 2 diabetes mellitus with diabetic chronic kidney disease: Secondary | ICD-10-CM | POA: Diagnosis not present

## 2017-05-14 DIAGNOSIS — K219 Gastro-esophageal reflux disease without esophagitis: Secondary | ICD-10-CM | POA: Diagnosis not present

## 2017-05-14 DIAGNOSIS — E1122 Type 2 diabetes mellitus with diabetic chronic kidney disease: Secondary | ICD-10-CM | POA: Diagnosis not present

## 2017-05-14 DIAGNOSIS — Z0000 Encounter for general adult medical examination without abnormal findings: Secondary | ICD-10-CM | POA: Diagnosis not present

## 2017-05-14 DIAGNOSIS — I10 Essential (primary) hypertension: Secondary | ICD-10-CM | POA: Diagnosis not present

## 2017-05-14 DIAGNOSIS — N183 Chronic kidney disease, stage 3 (moderate): Secondary | ICD-10-CM | POA: Diagnosis not present

## 2017-05-25 DIAGNOSIS — H353131 Nonexudative age-related macular degeneration, bilateral, early dry stage: Secondary | ICD-10-CM | POA: Diagnosis not present

## 2017-05-25 DIAGNOSIS — H2513 Age-related nuclear cataract, bilateral: Secondary | ICD-10-CM | POA: Diagnosis not present

## 2017-05-25 DIAGNOSIS — E119 Type 2 diabetes mellitus without complications: Secondary | ICD-10-CM | POA: Diagnosis not present

## 2017-07-16 DIAGNOSIS — N4340 Spermatocele of epididymis, unspecified: Secondary | ICD-10-CM | POA: Diagnosis not present

## 2017-07-16 DIAGNOSIS — R339 Retention of urine, unspecified: Secondary | ICD-10-CM | POA: Diagnosis not present

## 2017-07-16 DIAGNOSIS — N200 Calculus of kidney: Secondary | ICD-10-CM | POA: Diagnosis not present

## 2017-07-16 DIAGNOSIS — N320 Bladder-neck obstruction: Secondary | ICD-10-CM | POA: Diagnosis not present

## 2017-07-16 DIAGNOSIS — C61 Malignant neoplasm of prostate: Secondary | ICD-10-CM | POA: Diagnosis not present

## 2017-08-10 DIAGNOSIS — I10 Essential (primary) hypertension: Secondary | ICD-10-CM | POA: Diagnosis not present

## 2017-08-10 DIAGNOSIS — M4807 Spinal stenosis, lumbosacral region: Secondary | ICD-10-CM | POA: Diagnosis not present

## 2017-09-03 DIAGNOSIS — Z23 Encounter for immunization: Secondary | ICD-10-CM | POA: Diagnosis not present

## 2017-11-02 DIAGNOSIS — E782 Mixed hyperlipidemia: Secondary | ICD-10-CM | POA: Diagnosis not present

## 2017-11-02 DIAGNOSIS — I2510 Atherosclerotic heart disease of native coronary artery without angina pectoris: Secondary | ICD-10-CM | POA: Diagnosis not present

## 2017-11-02 DIAGNOSIS — I10 Essential (primary) hypertension: Secondary | ICD-10-CM | POA: Diagnosis not present

## 2017-11-02 DIAGNOSIS — N183 Chronic kidney disease, stage 3 (moderate): Secondary | ICD-10-CM | POA: Diagnosis not present

## 2017-11-02 DIAGNOSIS — K219 Gastro-esophageal reflux disease without esophagitis: Secondary | ICD-10-CM | POA: Diagnosis not present

## 2017-11-02 DIAGNOSIS — E1122 Type 2 diabetes mellitus with diabetic chronic kidney disease: Secondary | ICD-10-CM | POA: Diagnosis not present

## 2017-11-09 DIAGNOSIS — I10 Essential (primary) hypertension: Secondary | ICD-10-CM | POA: Diagnosis not present

## 2017-11-09 DIAGNOSIS — N183 Chronic kidney disease, stage 3 (moderate): Secondary | ICD-10-CM | POA: Diagnosis not present

## 2017-11-09 DIAGNOSIS — E1122 Type 2 diabetes mellitus with diabetic chronic kidney disease: Secondary | ICD-10-CM | POA: Diagnosis not present

## 2017-11-16 DIAGNOSIS — E1122 Type 2 diabetes mellitus with diabetic chronic kidney disease: Secondary | ICD-10-CM | POA: Diagnosis not present

## 2017-11-16 DIAGNOSIS — I10 Essential (primary) hypertension: Secondary | ICD-10-CM | POA: Diagnosis not present

## 2017-11-16 DIAGNOSIS — E782 Mixed hyperlipidemia: Secondary | ICD-10-CM | POA: Diagnosis not present

## 2017-11-16 DIAGNOSIS — J019 Acute sinusitis, unspecified: Secondary | ICD-10-CM | POA: Diagnosis not present

## 2017-11-16 DIAGNOSIS — N183 Chronic kidney disease, stage 3 (moderate): Secondary | ICD-10-CM | POA: Diagnosis not present

## 2017-12-28 DIAGNOSIS — D229 Melanocytic nevi, unspecified: Secondary | ICD-10-CM | POA: Diagnosis not present

## 2017-12-28 DIAGNOSIS — L821 Other seborrheic keratosis: Secondary | ICD-10-CM | POA: Diagnosis not present

## 2017-12-28 DIAGNOSIS — D1800 Hemangioma unspecified site: Secondary | ICD-10-CM | POA: Diagnosis not present

## 2017-12-28 DIAGNOSIS — Z1283 Encounter for screening for malignant neoplasm of skin: Secondary | ICD-10-CM | POA: Diagnosis not present

## 2017-12-28 DIAGNOSIS — L570 Actinic keratosis: Secondary | ICD-10-CM | POA: Diagnosis not present

## 2017-12-28 DIAGNOSIS — L814 Other melanin hyperpigmentation: Secondary | ICD-10-CM | POA: Diagnosis not present

## 2017-12-28 DIAGNOSIS — L739 Follicular disorder, unspecified: Secondary | ICD-10-CM | POA: Diagnosis not present

## 2018-02-01 DIAGNOSIS — J328 Other chronic sinusitis: Secondary | ICD-10-CM | POA: Diagnosis not present

## 2018-02-01 DIAGNOSIS — J3489 Other specified disorders of nose and nasal sinuses: Secondary | ICD-10-CM | POA: Diagnosis not present

## 2018-02-08 DIAGNOSIS — M4807 Spinal stenosis, lumbosacral region: Secondary | ICD-10-CM | POA: Diagnosis not present

## 2018-02-08 DIAGNOSIS — Z6825 Body mass index (BMI) 25.0-25.9, adult: Secondary | ICD-10-CM | POA: Diagnosis not present

## 2018-02-08 DIAGNOSIS — I10 Essential (primary) hypertension: Secondary | ICD-10-CM | POA: Diagnosis not present

## 2018-02-10 DIAGNOSIS — H353132 Nonexudative age-related macular degeneration, bilateral, intermediate dry stage: Secondary | ICD-10-CM | POA: Diagnosis not present

## 2018-02-22 DIAGNOSIS — E1122 Type 2 diabetes mellitus with diabetic chronic kidney disease: Secondary | ICD-10-CM | POA: Diagnosis not present

## 2018-02-22 DIAGNOSIS — N183 Chronic kidney disease, stage 3 (moderate): Secondary | ICD-10-CM | POA: Diagnosis not present

## 2018-02-22 DIAGNOSIS — E782 Mixed hyperlipidemia: Secondary | ICD-10-CM | POA: Diagnosis not present

## 2018-02-22 DIAGNOSIS — M79604 Pain in right leg: Secondary | ICD-10-CM | POA: Diagnosis not present

## 2018-02-22 DIAGNOSIS — I10 Essential (primary) hypertension: Secondary | ICD-10-CM | POA: Diagnosis not present

## 2018-02-22 DIAGNOSIS — I2510 Atherosclerotic heart disease of native coronary artery without angina pectoris: Secondary | ICD-10-CM | POA: Diagnosis not present

## 2018-02-22 DIAGNOSIS — M79605 Pain in left leg: Secondary | ICD-10-CM | POA: Diagnosis not present

## 2018-03-11 ENCOUNTER — Other Ambulatory Visit (INDEPENDENT_AMBULATORY_CARE_PROVIDER_SITE_OTHER): Payer: Self-pay | Admitting: Vascular Surgery

## 2018-03-11 DIAGNOSIS — M79604 Pain in right leg: Secondary | ICD-10-CM

## 2018-03-11 DIAGNOSIS — M79605 Pain in left leg: Principal | ICD-10-CM

## 2018-03-14 ENCOUNTER — Ambulatory Visit (INDEPENDENT_AMBULATORY_CARE_PROVIDER_SITE_OTHER): Payer: PPO | Admitting: Vascular Surgery

## 2018-03-14 ENCOUNTER — Encounter (INDEPENDENT_AMBULATORY_CARE_PROVIDER_SITE_OTHER): Payer: Self-pay

## 2018-03-14 ENCOUNTER — Ambulatory Visit (INDEPENDENT_AMBULATORY_CARE_PROVIDER_SITE_OTHER): Payer: PPO

## 2018-03-14 ENCOUNTER — Other Ambulatory Visit: Payer: Self-pay | Admitting: Internal Medicine

## 2018-03-14 DIAGNOSIS — M79604 Pain in right leg: Secondary | ICD-10-CM | POA: Diagnosis not present

## 2018-03-14 DIAGNOSIS — M79605 Pain in left leg: Secondary | ICD-10-CM

## 2018-03-29 ENCOUNTER — Encounter (INDEPENDENT_AMBULATORY_CARE_PROVIDER_SITE_OTHER): Payer: Self-pay | Admitting: *Deleted

## 2018-04-11 ENCOUNTER — Encounter (INDEPENDENT_AMBULATORY_CARE_PROVIDER_SITE_OTHER): Payer: Self-pay

## 2018-04-11 ENCOUNTER — Encounter (INDEPENDENT_AMBULATORY_CARE_PROVIDER_SITE_OTHER): Payer: Self-pay | Admitting: Vascular Surgery

## 2018-04-28 DIAGNOSIS — J019 Acute sinusitis, unspecified: Secondary | ICD-10-CM | POA: Diagnosis not present

## 2018-04-28 DIAGNOSIS — R062 Wheezing: Secondary | ICD-10-CM | POA: Diagnosis not present

## 2018-05-04 DIAGNOSIS — I10 Essential (primary) hypertension: Secondary | ICD-10-CM | POA: Diagnosis not present

## 2018-05-04 DIAGNOSIS — I2510 Atherosclerotic heart disease of native coronary artery without angina pectoris: Secondary | ICD-10-CM | POA: Diagnosis not present

## 2018-05-04 DIAGNOSIS — E782 Mixed hyperlipidemia: Secondary | ICD-10-CM | POA: Diagnosis not present

## 2018-05-04 DIAGNOSIS — N183 Chronic kidney disease, stage 3 (moderate): Secondary | ICD-10-CM | POA: Diagnosis not present

## 2018-05-04 DIAGNOSIS — E1122 Type 2 diabetes mellitus with diabetic chronic kidney disease: Secondary | ICD-10-CM | POA: Diagnosis not present

## 2018-05-11 DIAGNOSIS — N183 Chronic kidney disease, stage 3 (moderate): Secondary | ICD-10-CM | POA: Diagnosis not present

## 2018-05-11 DIAGNOSIS — E782 Mixed hyperlipidemia: Secondary | ICD-10-CM | POA: Diagnosis not present

## 2018-05-11 DIAGNOSIS — E1122 Type 2 diabetes mellitus with diabetic chronic kidney disease: Secondary | ICD-10-CM | POA: Diagnosis not present

## 2018-05-11 DIAGNOSIS — I10 Essential (primary) hypertension: Secondary | ICD-10-CM | POA: Diagnosis not present

## 2018-05-18 DIAGNOSIS — E1122 Type 2 diabetes mellitus with diabetic chronic kidney disease: Secondary | ICD-10-CM | POA: Diagnosis not present

## 2018-05-18 DIAGNOSIS — N182 Chronic kidney disease, stage 2 (mild): Secondary | ICD-10-CM | POA: Diagnosis not present

## 2018-05-18 DIAGNOSIS — Z0000 Encounter for general adult medical examination without abnormal findings: Secondary | ICD-10-CM | POA: Diagnosis not present

## 2018-05-18 DIAGNOSIS — M5136 Other intervertebral disc degeneration, lumbar region: Secondary | ICD-10-CM | POA: Diagnosis not present

## 2018-05-18 DIAGNOSIS — Z23 Encounter for immunization: Secondary | ICD-10-CM | POA: Diagnosis not present

## 2018-08-03 DIAGNOSIS — E1122 Type 2 diabetes mellitus with diabetic chronic kidney disease: Secondary | ICD-10-CM | POA: Diagnosis not present

## 2018-08-03 DIAGNOSIS — Z800 Family history of malignant neoplasm of digestive organs: Secondary | ICD-10-CM | POA: Diagnosis not present

## 2018-08-03 DIAGNOSIS — N183 Chronic kidney disease, stage 3 (moderate): Secondary | ICD-10-CM | POA: Diagnosis not present

## 2018-08-24 ENCOUNTER — Ambulatory Visit: Payer: PPO | Admitting: Urology

## 2018-08-24 ENCOUNTER — Encounter: Payer: Self-pay | Admitting: Urology

## 2018-08-24 VITALS — BP 160/68 | HR 77 | Ht 70.0 in | Wt 183.6 lb

## 2018-08-24 DIAGNOSIS — Z8546 Personal history of malignant neoplasm of prostate: Secondary | ICD-10-CM | POA: Diagnosis not present

## 2018-08-24 DIAGNOSIS — Z87442 Personal history of urinary calculi: Secondary | ICD-10-CM

## 2018-08-24 NOTE — Progress Notes (Signed)
08/24/2018 2:07 PM   Steven Wolfe 27-Apr-1936 [id number]  Referring provider: Glendon Axe, MD [facility] [place], [place] [address]  Chief Complaint  Patient presents with  . Establish Care   Urologic history: 1.  Prostate cancer -Prostate cryoablation November 2006; Gleason score and PSA at time of diagnosis not available on record review  2.  History nephrolithiasis   HPI: 82 year old male presents today to establish local urologic care.  He is status post prostate cryoablation in November 2006.  His PSA has remained very low and he last saw the [facility] in August 2018.  PSA at that visit was 0.9.  He has no bothersome lower urinary tract symptoms.  Denies dysuria or gross hematuria.  Denies flank, abdominal, pelvic or scrotal pain.  He also has a history of stone disease with 2-3 episodes of the past 25 years.  His last episode of renal colic was 30-86 years ago.  He has never required any stone intervention.   PMH: Past Medical History:  Diagnosis Date  . Arthritis   . Cancer ([place]) 02/24/2017   prostate  . Coronary artery disease   . Diabetes mellitus without complication ([place])   . History of kidney stones   . History of kidney stones   . Hypertension   . Prostate cancer [facility])     Surgical History: Past Surgical History:  Procedure Laterality Date  . CARDIAC CATHETERIZATION    . CHOLECYSTECTOMY  02/24/2017   20 years ago  . FEMUR FRACTURE SURGERY  50 years ago  . PROSTATE CRYOABLATION  2007    Home Medications:  Allergies as of 08/24/2018      Reactions   Bee Venom Shortness Of Breath   Difficulty breathing   Chlorthalidone Other (See Comments)   Unsure of reaction      Medication List        Accurate as of 08/24/18  2:07 PM. Always use your most recent med list.          amLODipine 5 MG tablet Commonly known as:  NORVASC Take 5 mg by mouth 2 (two) times daily.   cyclobenzaprine 10 MG tablet Commonly known as:   FLEXERIL Take 1 tablet (10 mg total) by mouth 3 (three) times daily as needed for muscle spasms.   docusate sodium 100 MG capsule Commonly known as:  COLACE Take 1 capsule (100 mg total) by mouth 2 (two) times daily.   freestyle lancets 1 each by Other route once daily Dx E11.9   gabapentin 100 MG capsule Commonly known as:  NEURONTIN   gemfibrozil 600 MG tablet Commonly known as:  LOPID Take 600 mg by mouth 2 (two) times daily.   glimepiride 1 MG tablet Commonly known as:  AMARYL Take 0.5 mg by mouth daily as needed. For blood sugar greater than 140.   multivitamin with minerals Tabs tablet Take 1 tablet by mouth daily. Multivitamins for Senior 50+   omeprazole 20 MG capsule Commonly known as:  PRILOSEC Take 20 mg by mouth daily.   oxyCODONE 5 MG immediate release tablet Commonly known as:  Oxy IR/ROXICODONE Take 1-2 tablets (5-10 mg total) by mouth every 3 (three) hours as needed for breakthrough pain.   PRECISION QID TEST test strip Generic drug:  glucose blood Use once daily Use as instructed. Precision Xtra Dx E11.9   PRESERVISION AREDS 2 PO Take 1 tablet by mouth daily.   ramipril 10 MG capsule Commonly known as:  ALTACE Take  10 mg by mouth 2 (two) times daily.   tetrahydrozoline 0.05 % ophthalmic solution Place 1 drop into both eyes 2 (two) times daily as needed (for scratchy eyes).   traMADol 50 MG tablet Commonly known as:  ULTRAM Take 50 mg by mouth 5 (five) times daily as needed. For back pain.       Allergies:  Allergies  Allergen Reactions  . Bee Venom Shortness Of Breath    Difficulty breathing   . Chlorthalidone Other (See Comments)    Unsure of reaction    Family History: Family History  Problem Relation Age of Onset  . Cancer Father   . Cancer Mother     Social History:  reports that he quit smoking about 16 years ago. His smoking use included cigarettes. His smokeless tobacco use includes chew. He reports that he drinks alcohol.  He reports that he does not use drugs.  ROS: UROLOGY Frequent Urination?: Yes Hard to postpone urination?: No Burning/pain with urination?: No Get up at night to urinate?: Yes Leakage of urine?: No Urine stream starts and stops?: No Trouble starting stream?: No Do you have to strain to urinate?: No Blood in urine?: No Urinary tract infection?: No Sexually transmitted disease?: No Injury to kidneys or bladder?: No Painful intercourse?: No Weak stream?: No Erection problems?: Yes Penile pain?: No  Gastrointestinal Nausea?: No Vomiting?: No Indigestion/heartburn?: No Diarrhea?: No Constipation?: No  Constitutional Fever: No Night sweats?: No Weight loss?: No Fatigue?: No  Skin Skin rash/lesions?: No Itching?: No  Eyes Blurred vision?: No Double vision?: No  Ears/Nose/Throat Sore throat?: No Sinus problems?: Yes  Hematologic/Lymphatic Swollen glands?: No Easy bruising?: No  Cardiovascular Leg swelling?: No Chest pain?: No  Respiratory Cough?: No Shortness of breath?: Yes  Endocrine Excessive thirst?: No  Musculoskeletal Back pain?: Yes Joint pain?: Yes  Neurological Headaches?: No Dizziness?: No  Psychologic Depression?: No Anxiety?: No  Physical Exam: BP (!) 160/68 (BP Location: Left Arm, Patient Position: Sitting, Cuff Size: Large)   Pulse 77   Ht 5\' 10"  (1.778 m)   Wt 183 lb 9.6 oz (83.3 kg)   BMI 26.34 kg/m   Constitutional:  Alert and oriented, No acute distress. HEENT: [place] AT, moist mucus membranes.  Trachea midline, no masses. Cardiovascular: No clubbing, cyanosis, or edema. Respiratory: Normal respiratory effort, no increased work of breathing. GI: Abdomen is soft, nontender, nondistended, no abdominal masses GU: No CVA tenderness.  Prostate flat, smooth, barely palpable Lymph: No cervical or inguinal lymphadenopathy. Skin: No rashes, bruises or suspicious lesions. Neurologic: Grossly intact, no focal deficits, moving all 4  extremities. Psychiatric: Normal mood and affect.   Assessment & Plan:    1. Personal history of prostate cancer He is 13 years out from prostate cryoablation and his PSA has remained < 1.  The chance of recurrence is very low.  Will recheck his PSA today and if stable will discontinue PSA checks.   Return in about 1 year (around 08/25/2019) for Recheck.  Abbie Sons, [place] [address], [place], [place] [address] [phone]

## 2018-08-25 LAB — PSA: Prostate Specific Ag, Serum: 1.1 ng/mL (ref 0.0–4.0)

## 2018-08-26 ENCOUNTER — Telehealth: Payer: Self-pay

## 2018-08-26 NOTE — Telephone Encounter (Signed)
-----   Message from Abbie Sons, MD sent at 08/26/2018  8:28 AM EDT ----- PSA stable at 1.1.  Follow-up as scheduled

## 2018-08-26 NOTE — Telephone Encounter (Signed)
Left mess to call 

## 2018-08-30 ENCOUNTER — Telehealth: Payer: Self-pay | Admitting: Urology

## 2018-08-30 NOTE — Telephone Encounter (Signed)
Pt returned call and I read message from [place] 

## 2018-09-16 DIAGNOSIS — Z23 Encounter for immunization: Secondary | ICD-10-CM | POA: Diagnosis not present

## 2018-09-20 ENCOUNTER — Other Ambulatory Visit: Payer: Self-pay

## 2018-09-20 NOTE — Patient Outreach (Signed)
[facility]) Care Management  09/20/2018  Steven Wolfe 1936-10-01 [id number]   Telephone Screen  Referral Date: 10/719 Referral Source: Nurse Call Center Referral Reason: " caller and spouse both taking Tramadol, both are experiencing hot flashes, wants to know if it's related to Tramadol" Insurance: HTA   Outreach attempt #1 to patient. No answer at present. RN CM left HIPAA compliant voicemail message along with contact info.     Plan: RN CM will make outreach attempt to patient within 3-4 business days. RN CM will send unsuccessful outreach letter to patient.   Enzo Montgomery, RN,BSN,CCM [place] Management Telephonic Care Management Coordinator Direct Phone: [phone] Toll Free: [phone] Fax: [phone]

## 2018-09-20 NOTE — Patient Outreach (Addendum)
[facility]) Care Management  09/20/2018  Steven Wolfe June 29, 1936 [id number]   Telephone Screen  Referral Date: 10/719 Referral Source: Nurse Call Center Referral Reason: " caller and spouse both taking Tramadol, both are experiencing hot flashes, wants to know if it's related to Tramadol" Insurance: HTA   Incoming call from patient. Spoke with patient and discussed nurse line call. Patient reports that nurse that he spoke with gave them good info on the side effects of taking Tramadol. He feels that him and his wife are experiencing the common side effects related to med. He voices that he has not contacted PCP yet. RN CM advised patient to alert PCP and discuss the issue with the MD as soon as possible. He voiced understanding and reported he would call MD office today. He denies any further RN CM needs or concerns at this time. Patient made aware that he can contact 24hr nurse line at anytime for future questions and concerns. He voiced understanding and was appreciative of follow up call.      Plan: RN CM will close case at this time .  Enzo Montgomery, RN,BSN,CCM [place] Management Telephonic Care Management Coordinator Direct Phone: [phone] Toll Free: [phone] Fax: [phone]

## 2018-10-11 ENCOUNTER — Encounter: Payer: Self-pay | Admitting: *Deleted

## 2018-10-12 ENCOUNTER — Ambulatory Visit
Admission: RE | Admit: 2018-10-12 | Discharge: 2018-10-12 | Disposition: A | Discharge status: Home / Self Care | Payer: PPO | Source: Ambulatory Visit | Attending: Unknown Physician Specialty | Admitting: Unknown Physician Specialty

## 2018-10-12 ENCOUNTER — Ambulatory Visit: Payer: PPO | Admitting: Anesthesiology

## 2018-10-12 ENCOUNTER — Encounter: Payer: Self-pay | Admitting: Anesthesiology

## 2018-10-12 ENCOUNTER — Encounter
Admission: RE | Disposition: A | Discharge status: Home / Self Care | Payer: Self-pay | Source: Ambulatory Visit | Attending: Unknown Physician Specialty

## 2018-10-12 DIAGNOSIS — Z79899 Other long term (current) drug therapy: Secondary | ICD-10-CM | POA: Diagnosis not present

## 2018-10-12 DIAGNOSIS — Z8546 Personal history of malignant neoplasm of prostate: Secondary | ICD-10-CM | POA: Diagnosis not present

## 2018-10-12 DIAGNOSIS — E119 Type 2 diabetes mellitus without complications: Secondary | ICD-10-CM | POA: Diagnosis not present

## 2018-10-12 DIAGNOSIS — K5730 Diverticulosis of large intestine without perforation or abscess without bleeding: Secondary | ICD-10-CM | POA: Diagnosis not present

## 2018-10-12 DIAGNOSIS — I129 Hypertensive chronic kidney disease with stage 1 through stage 4 chronic kidney disease, or unspecified chronic kidney disease: Secondary | ICD-10-CM | POA: Diagnosis not present

## 2018-10-12 DIAGNOSIS — Z87891 Personal history of nicotine dependence: Secondary | ICD-10-CM | POA: Insufficient documentation

## 2018-10-12 DIAGNOSIS — J449 Chronic obstructive pulmonary disease, unspecified: Secondary | ICD-10-CM | POA: Insufficient documentation

## 2018-10-12 DIAGNOSIS — Z888 Allergy status to other drugs, medicaments and biological substances status: Secondary | ICD-10-CM | POA: Diagnosis not present

## 2018-10-12 DIAGNOSIS — I491 Atrial premature depolarization: Secondary | ICD-10-CM | POA: Insufficient documentation

## 2018-10-12 DIAGNOSIS — I4510 Unspecified right bundle-branch block: Secondary | ICD-10-CM | POA: Insufficient documentation

## 2018-10-12 DIAGNOSIS — M1990 Unspecified osteoarthritis, unspecified site: Secondary | ICD-10-CM | POA: Diagnosis not present

## 2018-10-12 DIAGNOSIS — N183 Chronic kidney disease, stage 3 (moderate): Secondary | ICD-10-CM | POA: Diagnosis not present

## 2018-10-12 DIAGNOSIS — Z800 Family history of malignant neoplasm of digestive organs: Secondary | ICD-10-CM | POA: Insufficient documentation

## 2018-10-12 DIAGNOSIS — K219 Gastro-esophageal reflux disease without esophagitis: Secondary | ICD-10-CM | POA: Diagnosis not present

## 2018-10-12 DIAGNOSIS — K5790 Diverticulosis of intestine, part unspecified, without perforation or abscess without bleeding: Secondary | ICD-10-CM | POA: Diagnosis not present

## 2018-10-12 DIAGNOSIS — D120 Benign neoplasm of cecum: Secondary | ICD-10-CM | POA: Diagnosis not present

## 2018-10-12 DIAGNOSIS — I2510 Atherosclerotic heart disease of native coronary artery without angina pectoris: Secondary | ICD-10-CM | POA: Diagnosis not present

## 2018-10-12 DIAGNOSIS — I10 Essential (primary) hypertension: Secondary | ICD-10-CM | POA: Insufficient documentation

## 2018-10-12 DIAGNOSIS — E782 Mixed hyperlipidemia: Secondary | ICD-10-CM | POA: Diagnosis not present

## 2018-10-12 DIAGNOSIS — Z86010 Personal history of colonic polyps: Secondary | ICD-10-CM | POA: Diagnosis not present

## 2018-10-12 DIAGNOSIS — E1122 Type 2 diabetes mellitus with diabetic chronic kidney disease: Secondary | ICD-10-CM | POA: Diagnosis not present

## 2018-10-12 DIAGNOSIS — Z91030 Bee allergy status: Secondary | ICD-10-CM | POA: Insufficient documentation

## 2018-10-12 DIAGNOSIS — E785 Hyperlipidemia, unspecified: Secondary | ICD-10-CM | POA: Insufficient documentation

## 2018-10-12 DIAGNOSIS — Z1211 Encounter for screening for malignant neoplasm of colon: Secondary | ICD-10-CM | POA: Insufficient documentation

## 2018-10-12 DIAGNOSIS — K640 First degree hemorrhoids: Secondary | ICD-10-CM | POA: Insufficient documentation

## 2018-10-12 DIAGNOSIS — Z7984 Long term (current) use of oral hypoglycemic drugs: Secondary | ICD-10-CM | POA: Insufficient documentation

## 2018-10-12 DIAGNOSIS — K635 Polyp of colon: Secondary | ICD-10-CM | POA: Diagnosis not present

## 2018-10-12 DIAGNOSIS — D126 Benign neoplasm of colon, unspecified: Secondary | ICD-10-CM | POA: Diagnosis not present

## 2018-10-12 HISTORY — DX: Spinal stenosis, lumbar region with neurogenic claudication: M48.062

## 2018-10-12 HISTORY — DX: Chronic obstructive pulmonary disease, unspecified: J44.9

## 2018-10-12 HISTORY — DX: Other intervertebral disc degeneration, lumbosacral region without mention of lumbar back pain or lower extremity pain: M51.379

## 2018-10-12 HISTORY — DX: Other intervertebral disc degeneration, lumbosacral region: M51.37

## 2018-10-12 HISTORY — PX: COLONOSCOPY WITH PROPOFOL: SHX5780

## 2018-10-12 HISTORY — DX: Other intervertebral disc displacement, lumbar region: M51.26

## 2018-10-12 HISTORY — DX: Radiculopathy, lumbar region: M54.16

## 2018-10-12 HISTORY — DX: Hyperlipidemia, unspecified: E78.5

## 2018-10-12 HISTORY — DX: Chronic sinusitis, unspecified: J32.9

## 2018-10-12 HISTORY — DX: Unspecified hemorrhoids: K64.9

## 2018-10-12 HISTORY — DX: Other intervertebral disc degeneration, lumbar region without mention of lumbar back pain or lower extremity pain: M51.369

## 2018-10-12 HISTORY — DX: Other intervertebral disc degeneration, lumbar region: M51.36

## 2018-10-12 HISTORY — DX: Gastro-esophageal reflux disease without esophagitis: K21.9

## 2018-10-12 LAB — GLUCOSE, CAPILLARY
Glucose-Capillary: 109 mg/dL — ABNORMAL HIGH (ref 70–99)
Glucose-Capillary: 105 mg/dL — ABNORMAL HIGH (ref 70–99)

## 2018-10-12 SURGERY — COLONOSCOPY WITH PROPOFOL
Anesthesia: General

## 2018-10-12 MED ORDER — PROPOFOL 10 MG/ML IV BOLUS
INTRAVENOUS | Status: DC | PRN
  Administered 2018-10-12: 30 mg via INTRAVENOUS
  Administered 2018-10-12: 20 mg via INTRAVENOUS
  Administered 2018-10-12: 50 mg via INTRAVENOUS

## 2018-10-12 MED ORDER — PROPOFOL 500 MG/50ML IV EMUL
INTRAVENOUS | Status: AC
  Filled 2018-10-12: qty 50

## 2018-10-12 MED ORDER — SODIUM CHLORIDE 0.9 % IV SOLN: INTRAVENOUS | Status: DC

## 2018-10-12 MED ORDER — PROPOFOL 500 MG/50ML IV EMUL
INTRAVENOUS | Status: DC | PRN
  Administered 2018-10-12: 80 ug/kg/min via INTRAVENOUS

## 2018-10-12 MED ORDER — SODIUM CHLORIDE 0.9 % IV SOLN
INTRAVENOUS | Status: DC
  Administered 2018-10-12 (×2): via INTRAVENOUS

## 2018-10-12 NOTE — Transfer of Care (Signed)
Immediate Anesthesia Transfer of Care Note  Patient: Steven Wolfe  Procedure(s) Performed: COLONOSCOPY WITH PROPOFOL (N/A )  Patient Location: PACU  Anesthesia Type:General  Level of Consciousness: awake  Airway & Oxygen Therapy: Patient Spontanous Breathing  Post-op Assessment: Report given to RN  Post vital signs: stable  Last Vitals:  Vitals Value Taken Time  BP 123/68 10/12/2018 10:59 AM  Temp    Pulse 86 10/12/2018 10:59 AM  Resp 17 10/12/2018 10:59 AM  SpO2 98 % 10/12/2018 10:59 AM    Last Pain:  Vitals:   10/12/18 0943  TempSrc: Tympanic  PainSc: 0-No pain         Complications: No apparent anesthesia complications

## 2018-10-12 NOTE — H&P (Signed)
Primary Care Physician:  Glendon Axe, MD Primary Gastroenterologist:  Dr. Vira Agar  Pre-Procedure History & Physical: HPI:  Steven Wolfe is a 82 y.o. male is here for an colonoscopy.   Past Medical History:  Diagnosis Date  . Arthritis   . Bulging lumbar disc   . Cancer ([place]) 02/24/2017   prostate  . Chronic sinusitis   . COPD (chronic obstructive pulmonary disease) ([place])   . Coronary artery disease   . Degeneration, intervertebral disc, lumbosacral   . Diabetes mellitus without complication ([place])   . GERD (gastroesophageal reflux disease)   . Hemorrhoids   . History of kidney stones   . History of kidney stones   . Hyperlipemia   . Hypertension   . Lumbar radiculitis   . Lumbar stenosis with neurogenic claudication   . Prostate cancer [facility])     Past Surgical History:  Procedure Laterality Date  . CARDIAC CATHETERIZATION    . CHOLECYSTECTOMY  02/24/2017   20 years ago  . COLONOSCOPY    . FEMUR FRACTURE SURGERY  50 years ago  . FUNCTIONAL ENDOSCOPIC SINUS SURGERY    . LEG SURGERY Left    ORIF   . PROSTATE CRYOABLATION  2007    Prior to Admission medications   Medication Sig Start Date End Date Taking? Authorizing Provider  amLODipine (NORVASC) 5 MG tablet Take 5 mg by mouth 2 (two) times daily. 12/02/16  Yes [provider]  cyclobenzaprine (FLEXERIL) 10 MG tablet Take 1 tablet (10 mg total) by mouth 3 (three) times daily as needed for muscle spasms. 03/06/17  Yes Newman Pies, MD  docusate sodium (COLACE) 100 MG capsule Take 1 capsule (100 mg total) by mouth 2 (two) times daily. 03/06/17  Yes Newman Pies, MD  gabapentin (NEURONTIN) 100 MG capsule  08/23/18  Yes [provider]  gemfibrozil (LOPID) 600 MG tablet Take 600 mg by mouth 2 (two) times daily.   Yes [provider]  glimepiride (AMARYL) 1 MG tablet Take 0.5 mg by mouth daily as needed. For blood sugar greater than 140.   Yes [provider]  glucose blood  (PRECISION QID TEST) test strip Use once daily Use as instructed. Precision Xtra Dx E11.9 04/21/18 04/21/19 Yes [provider]  Lancets (FREESTYLE) lancets 1 each by Other route once daily Dx E11.9 04/21/18  Yes [provider]  ramipril (ALTACE) 10 MG capsule Take 10 mg by mouth 2 (two) times daily. 01/11/17  Yes [provider]  tetrahydrozoline 0.05 % ophthalmic solution Place 1 drop into both eyes 2 (two) times daily as needed (for scratchy eyes).   Yes [provider]  tetrahydrozoline-zinc (VISINE-AC) 0.05-0.25 % ophthalmic solution 2 drops 3 (three) times daily as needed.   Yes [provider]  traMADol (ULTRAM) 50 MG tablet Take 50 mg by mouth 5 (five) times daily as needed. For back pain. 01/11/17  Yes [provider]  Multiple Vitamin (MULTIVITAMIN WITH MINERALS) TABS tablet Take 1 tablet by mouth daily. Multivitamins for Senior 50+    [provider]  Multiple Vitamins-Minerals (PRESERVISION AREDS 2 PO) Take 1 tablet by mouth daily.    [provider]  omeprazole (PRILOSEC) 20 MG capsule Take 20 mg by mouth daily. 12/24/16   [provider]  oxyCODONE (OXY IR/ROXICODONE) 5 MG immediate release tablet Take 1-2 tablets (5-10 mg total) by mouth every 3 (three) hours as needed for breakthrough pain. Patient not taking: Reported on 10/12/2018 03/06/17   Newman Pies,  MD    Allergies as of 09/09/2018 - Review Complete 08/24/2018  Allergen Reaction Noted  . Bee venom Shortness Of Breath 02/22/2017  . Chlorthalidone Other (See Comments) 02/19/2015    Family History  Problem Relation Age of Onset  . Cancer Father   . Cancer Mother     Social History   Socioeconomic History  . Marital status: Married    Spouse name: Not on file  . Number of children: Not on file  . Years of education: Not on file  . Highest education level: Not on file  Occupational History  . Not on file  Social Needs  . Financial  resource strain: Not on file  . Food insecurity:    Worry: Not on file    Inability: Not on file  . Transportation needs:    Medical: Not on file    Non-medical: Not on file  Tobacco Use  . Smoking status: Former Smoker    Types: Cigarettes    Last attempt to quit: 02/24/2002    Years since quitting: 16.6  . Smokeless tobacco: Current User    Types: Chew  Substance and Sexual Activity  . Alcohol use: Yes    Comment: occasionally  . Drug use: No  . Sexual activity: Yes  Lifestyle  . Physical activity:    Days per week: Not on file    Minutes per session: Not on file  . Stress: Not on file  Relationships  . Social connections:    Talks on phone: Not on file    Gets together: Not on file    Attends religious service: Not on file    Active member of club or organization: Not on file    Attends meetings of clubs or organizations: Not on file    Relationship status: Not on file  . Intimate partner violence:    Fear of current or ex partner: Not on file    Emotionally abused: Not on file    Physically abused: Not on file    Forced sexual activity: Not on file  Other Topics Concern  . Not on file  Social History Narrative  . Not on file    Review of Systems: See HPI, otherwise negative ROS  Physical Exam: BP 137/89   Pulse 62   Temp 98.2 F (36.8 C) (Tympanic)   Resp 18   Ht 5\' 10"  (1.778 m)   Wt 80.7 kg   SpO2 96%   BMI 25.54 kg/m  General:   Alert,  pleasant and cooperative in NAD Head:  Normocephalic and atraumatic. Neck:  Supple; no masses or thyromegaly. Lungs:  Clear throughout to auscultation.    Heart:  Regular rate and rhythm. Abdomen:  Soft, nontender and nondistended. Normal bowel sounds, without guarding, and without rebound.   Neurologic:  Alert and  oriented x4;  grossly normal neurologically.  Impression/Plan: Steven Wolfe is here for an colonoscopy to be performed for FH colon cancer.  Last colon was 07/28/13.  Risks, benefits, limitations,  and alternatives regarding  colonoscopy have been reviewed with the patient.  Questions have been answered.  All parties agreeable.   [name], MD  10/12/2018, 10:25 AM

## 2018-10-12 NOTE — Anesthesia Postprocedure Evaluation (Signed)
Anesthesia Post Note  Patient: NIVAN MELENDREZ  Procedure(s) Performed: COLONOSCOPY WITH PROPOFOL (N/A )  Patient location during evaluation: Endoscopy Anesthesia Type: General Level of consciousness: awake and alert Pain management: pain level controlled Vital Signs Assessment: post-procedure vital signs reviewed and stable Respiratory status: spontaneous breathing, nonlabored ventilation, respiratory function stable and patient connected to nasal cannula oxygen Cardiovascular status: blood pressure returned to baseline and stable Postop Assessment: no apparent nausea or vomiting Anesthetic complications: no     Last Vitals:  Vitals:   10/12/18 1057 10/12/18 1059  BP:  123/68  Pulse:  86  Resp:  17  Temp: (!) 36.1 C   SpO2:  98%    Last Pain:  Vitals:   10/12/18 1135  TempSrc:   PainSc: 0-No pain                 Sarayu Prevost S

## 2018-10-12 NOTE — Anesthesia Post-op Follow-up Note (Signed)
Anesthesia QCDR form completed.        

## 2018-10-12 NOTE — Anesthesia Preprocedure Evaluation (Addendum)
Anesthesia Evaluation  Patient identified by MRN, date of birth, ID band Patient awake    Reviewed: Allergy & Precautions, NPO status , Patient's Chart, lab work & pertinent test results, reviewed documented beta blocker date and time   Airway Mallampati: II  TM Distance: >3 FB     Dental  (+) Chipped, Missing   Pulmonary COPD, former smoker,           Cardiovascular hypertension, Pt. on medications + CAD       Neuro/Psych  Neuromuscular disease    GI/Hepatic GERD  ,  Endo/Other  diabetes, Type 2  Renal/GU Renal disease     Musculoskeletal  (+) Arthritis ,   Abdominal   Peds  Hematology   Anesthesia Other Findings Has had PACs and Rbbb for a while.  Reproductive/Obstetrics                            Anesthesia Physical Anesthesia Plan  ASA: III  Anesthesia Plan: General   Post-op Pain Management:    Induction: Intravenous  PONV Risk Score and Plan:   Airway Management Planned:   Additional Equipment:   Intra-op Plan:   Post-operative Plan:   Informed Consent: I have reviewed the patients History and Physical, chart, labs and discussed the procedure including the risks, benefits and alternatives for the proposed anesthesia with the patient or authorized representative who has indicated his/her understanding and acceptance.     Plan Discussed with: CRNA  Anesthesia Plan Comments:         Anesthesia Quick Evaluation

## 2018-10-12 NOTE — Op Note (Signed)
[facility] Gastroenterology Patient Name: Steven Wolfe Procedure Date: 10/12/2018 10:27 AM MRN: [id number] Account #: [id number] Date of Birth: 10-Dec-1936 Admit Type: Outpatient Age: 82 Room: [facility] ENDO ROOM 2 Gender: Male Note Status: Finalized Procedure:            Colonoscopy Indications:          Screening for colorectal malignant neoplasm Providers:            Manya Silvas, MD Referring MD:         Glendon Axe (Referring MD) Medicines:            Propofol per Anesthesia Complications:        No immediate complications. Procedure:            Pre-Anesthesia Assessment:                       - After reviewing the risks and benefits, the patient                        was deemed in satisfactory condition to undergo the                        procedure.                       After obtaining informed consent, the colonoscope was                        passed under direct vision. Throughout the procedure,                        the patient's blood pressure, pulse, and oxygen                        saturations were monitored continuously. The                        Colonoscope was introduced through the anus and                        advanced to the the cecum, identified by appendiceal                        orifice and ileocecal valve. The colonoscopy was                        somewhat difficult due to a tortuous colon. Successful                        completion of the procedure was aided by applying                        abdominal pressure. The patient tolerated the procedure                        well. The quality of the bowel preparation was                        excellent. Findings:      Multiple small and large-mouthed diverticula were found in the sigmoid       colon,  descending colon, transverse colon and ascending colon.      Internal hemorrhoids were found during endoscopy. The hemorrhoids were       small and Grade I (internal  hemorrhoids that do not prolapse).      The exam was otherwise without abnormality.      A diminutive polyp was found in the cecum. The polyp was sessile. and       very small. The polyp was removed with a jumbo cold forceps. Resection       and retrieval were complete. Impression:           - Diverticulosis in the sigmoid colon, in the                        descending colon, in the transverse colon and in the                        ascending colon.                       - Internal hemorrhoids.                       - The examination was otherwise normal.                       - One diminutive polyp in the cecum, removed with a                        jumbo cold forceps. Resected and retrieved. Recommendation:       - Await pathology results. Manya Silvas, MD 10/12/2018 10:59:04 AM This report has been signed electronically. Number of Addenda: 0 Note Initiated On: 10/12/2018 10:27 AM Scope Withdrawal Time: 0 hours 11 minutes 55 seconds  Total Procedure Duration: 0 hours 24 minutes 2 seconds       [facility]

## 2018-10-13 ENCOUNTER — Encounter: Payer: Self-pay | Admitting: Unknown Physician Specialty

## 2018-10-14 LAB — SURGICAL PATHOLOGY

## 2018-11-07 DIAGNOSIS — N183 Chronic kidney disease, stage 3 (moderate): Secondary | ICD-10-CM | POA: Diagnosis not present

## 2018-11-07 DIAGNOSIS — E1122 Type 2 diabetes mellitus with diabetic chronic kidney disease: Secondary | ICD-10-CM | POA: Diagnosis not present

## 2018-11-07 DIAGNOSIS — I2510 Atherosclerotic heart disease of native coronary artery without angina pectoris: Secondary | ICD-10-CM | POA: Diagnosis not present

## 2018-11-07 DIAGNOSIS — I10 Essential (primary) hypertension: Secondary | ICD-10-CM | POA: Diagnosis not present

## 2018-11-07 DIAGNOSIS — R0602 Shortness of breath: Secondary | ICD-10-CM | POA: Diagnosis not present

## 2018-11-07 DIAGNOSIS — E782 Mixed hyperlipidemia: Secondary | ICD-10-CM | POA: Diagnosis not present

## 2018-11-09 DIAGNOSIS — N182 Chronic kidney disease, stage 2 (mild): Secondary | ICD-10-CM | POA: Diagnosis not present

## 2018-11-09 DIAGNOSIS — E1122 Type 2 diabetes mellitus with diabetic chronic kidney disease: Secondary | ICD-10-CM | POA: Diagnosis not present

## 2018-11-17 DIAGNOSIS — I10 Essential (primary) hypertension: Secondary | ICD-10-CM | POA: Diagnosis not present

## 2018-11-17 DIAGNOSIS — E782 Mixed hyperlipidemia: Secondary | ICD-10-CM | POA: Diagnosis not present

## 2018-11-17 DIAGNOSIS — M5136 Other intervertebral disc degeneration, lumbar region: Secondary | ICD-10-CM | POA: Diagnosis not present

## 2018-11-17 DIAGNOSIS — E1122 Type 2 diabetes mellitus with diabetic chronic kidney disease: Secondary | ICD-10-CM | POA: Diagnosis not present

## 2018-11-17 DIAGNOSIS — N182 Chronic kidney disease, stage 2 (mild): Secondary | ICD-10-CM | POA: Diagnosis not present

## 2018-12-09 DIAGNOSIS — J019 Acute sinusitis, unspecified: Secondary | ICD-10-CM | POA: Diagnosis not present

## 2018-12-09 DIAGNOSIS — J069 Acute upper respiratory infection, unspecified: Secondary | ICD-10-CM | POA: Diagnosis not present

## 2018-12-27 DIAGNOSIS — Z1283 Encounter for screening for malignant neoplasm of skin: Secondary | ICD-10-CM | POA: Diagnosis not present

## 2018-12-27 DIAGNOSIS — L820 Inflamed seborrheic keratosis: Secondary | ICD-10-CM | POA: Diagnosis not present

## 2018-12-27 DIAGNOSIS — D1800 Hemangioma unspecified site: Secondary | ICD-10-CM | POA: Diagnosis not present

## 2018-12-27 DIAGNOSIS — D225 Melanocytic nevi of trunk: Secondary | ICD-10-CM | POA: Diagnosis not present

## 2018-12-27 DIAGNOSIS — L578 Other skin changes due to chronic exposure to nonionizing radiation: Secondary | ICD-10-CM | POA: Diagnosis not present

## 2018-12-27 DIAGNOSIS — L812 Freckles: Secondary | ICD-10-CM | POA: Diagnosis not present

## 2018-12-27 DIAGNOSIS — L821 Other seborrheic keratosis: Secondary | ICD-10-CM | POA: Diagnosis not present

## 2018-12-27 DIAGNOSIS — D485 Neoplasm of uncertain behavior of skin: Secondary | ICD-10-CM | POA: Diagnosis not present

## 2019-02-07 DIAGNOSIS — Z6826 Body mass index (BMI) 26.0-26.9, adult: Secondary | ICD-10-CM | POA: Diagnosis not present

## 2019-02-07 DIAGNOSIS — M4807 Spinal stenosis, lumbosacral region: Secondary | ICD-10-CM | POA: Diagnosis not present

## 2019-02-07 DIAGNOSIS — M545 Low back pain: Secondary | ICD-10-CM | POA: Diagnosis not present

## 2019-02-07 DIAGNOSIS — I10 Essential (primary) hypertension: Secondary | ICD-10-CM | POA: Diagnosis not present

## 2019-04-27 DIAGNOSIS — H353131 Nonexudative age-related macular degeneration, bilateral, early dry stage: Secondary | ICD-10-CM | POA: Diagnosis not present

## 2019-05-01 DIAGNOSIS — E782 Mixed hyperlipidemia: Secondary | ICD-10-CM | POA: Diagnosis not present

## 2019-05-01 DIAGNOSIS — I10 Essential (primary) hypertension: Secondary | ICD-10-CM | POA: Diagnosis not present

## 2019-05-01 DIAGNOSIS — I2510 Atherosclerotic heart disease of native coronary artery without angina pectoris: Secondary | ICD-10-CM | POA: Diagnosis not present

## 2019-05-29 DIAGNOSIS — I10 Essential (primary) hypertension: Secondary | ICD-10-CM | POA: Diagnosis not present

## 2019-05-29 DIAGNOSIS — E1122 Type 2 diabetes mellitus with diabetic chronic kidney disease: Secondary | ICD-10-CM | POA: Diagnosis not present

## 2019-05-29 DIAGNOSIS — N182 Chronic kidney disease, stage 2 (mild): Secondary | ICD-10-CM | POA: Diagnosis not present

## 2019-06-05 DIAGNOSIS — I10 Essential (primary) hypertension: Secondary | ICD-10-CM | POA: Diagnosis not present

## 2019-06-05 DIAGNOSIS — I2510 Atherosclerotic heart disease of native coronary artery without angina pectoris: Secondary | ICD-10-CM | POA: Diagnosis not present

## 2019-06-05 DIAGNOSIS — J449 Chronic obstructive pulmonary disease, unspecified: Secondary | ICD-10-CM | POA: Diagnosis not present

## 2019-06-05 DIAGNOSIS — N183 Chronic kidney disease, stage 3 (moderate): Secondary | ICD-10-CM | POA: Diagnosis not present

## 2019-06-05 DIAGNOSIS — Z0001 Encounter for general adult medical examination with abnormal findings: Secondary | ICD-10-CM | POA: Diagnosis not present

## 2019-06-05 DIAGNOSIS — E782 Mixed hyperlipidemia: Secondary | ICD-10-CM | POA: Diagnosis not present

## 2019-06-05 DIAGNOSIS — Z0000 Encounter for general adult medical examination without abnormal findings: Secondary | ICD-10-CM | POA: Diagnosis not present

## 2019-06-05 DIAGNOSIS — E1122 Type 2 diabetes mellitus with diabetic chronic kidney disease: Secondary | ICD-10-CM | POA: Diagnosis not present

## 2019-08-28 ENCOUNTER — Ambulatory Visit: Payer: PPO | Admitting: Urology

## 2019-09-15 ENCOUNTER — Encounter: Payer: Self-pay | Admitting: Urology

## 2019-09-15 ENCOUNTER — Ambulatory Visit
Admission: RE | Admit: 2019-09-15 | Discharge: 2019-09-15 | Disposition: A | Discharge status: Home / Self Care | Payer: PPO | Source: Ambulatory Visit | Attending: Urology | Admitting: Urology

## 2019-09-15 ENCOUNTER — Ambulatory Visit: Payer: PPO | Admitting: Urology

## 2019-09-15 ENCOUNTER — Other Ambulatory Visit: Payer: Self-pay

## 2019-09-15 VITALS — BP 138/74 | HR 81 | Ht 70.0 in | Wt 179.3 lb

## 2019-09-15 DIAGNOSIS — Z8546 Personal history of malignant neoplasm of prostate: Secondary | ICD-10-CM

## 2019-09-15 DIAGNOSIS — Z87442 Personal history of urinary calculi: Secondary | ICD-10-CM | POA: Diagnosis not present

## 2019-09-15 DIAGNOSIS — Z23 Encounter for immunization: Secondary | ICD-10-CM | POA: Diagnosis not present

## 2019-09-15 DIAGNOSIS — I878 Other specified disorders of veins: Secondary | ICD-10-CM | POA: Diagnosis not present

## 2019-09-15 NOTE — Progress Notes (Signed)
09/15/2019 10:40 AM   Steven Wolfe [birth year] [id number]  Referring provider: Glendon Axe, MD No address on file  Chief Complaint  Patient presents with  . Prostate Cancer    Urologic history:  1.  Prostate cancer -Prostate cryoablation November 2006; Gleason score and PSA at time of diagnosis not available on record review  2.  History nephrolithiasis   HPI: 83 y.o. male presents for annual follow-up.  He states he has done quite well the past year.  He has no bothersome lower urinary tract symptoms.  Denies dysuria or gross hematuria.  Denies flank, abdominal or pelvic pain.  His PSA has been in the 0.5 range for several years.  His PSA last year was 1.1.   PMH: Past Medical History:  Diagnosis Date  . Arthritis   . Bulging lumbar disc   . Cancer ([place]) 02/24/2017   prostate  . Chronic sinusitis   . COPD (chronic obstructive pulmonary disease) ([place])   . Coronary artery disease   . Degeneration, intervertebral disc, lumbosacral   . Diabetes mellitus without complication ([place])   . GERD (gastroesophageal reflux disease)   . Hemorrhoids   . History of kidney stones   . History of kidney stones   . Hyperlipemia   . Hypertension   . Lumbar radiculitis   . Lumbar stenosis with neurogenic claudication   . Prostate cancer [facility])     Surgical History: Past Surgical History:  Procedure Laterality Date  . CARDIAC CATHETERIZATION    . CHOLECYSTECTOMY  02/24/2017   20 years ago  . COLONOSCOPY    . COLONOSCOPY WITH PROPOFOL N/A 10/12/2018   Procedure: COLONOSCOPY WITH PROPOFOL;  Surgeon: Manya Silvas, MD;  Location[facility] END[facility]COPY;  Service: Endoscopy;  Laterality: N/A;  . FEMUR FRACTURE SURGERY  50 years ago  . FUNCTIONAL ENDOSCOPIC SINUS SURGERY    . LEG SURGERY Left    ORIF   . PROSTATE CRYOABLATION  2007    Home Medications:  Allergies as of 09/15/2019      Reactions   Bee Venom Shortness Of Breath   Difficulty breathing   Chlorthalidone  Other (See Comments)   Unsure of reaction      Medication List       Accurate as of September 15, 2019 10:40 AM. If you have any questions, ask your nurse or doctor.        amLODipine 5 MG tablet Commonly known as: NORVASC Take 5 mg by mouth 2 (two) times daily.   cyclobenzaprine 10 MG tablet Commonly known as: FLEXERIL Take 1 tablet (10 mg total) by mouth 3 (three) times daily as needed for muscle spasms.   docusate sodium 100 MG capsule Commonly known as: COLACE Take 1 capsule (100 mg total) by mouth 2 (two) times daily.   freestyle lancets 1 each by Other route once daily Dx E11.9   gabapentin 100 MG capsule Commonly known as: NEURONTIN   gemfibrozil 600 MG tablet Commonly known as: LOPID Take 600 mg by mouth 2 (two) times daily.   glimepiride 1 MG tablet Commonly known as: AMARYL Take 0.5 mg by mouth daily as needed. For blood sugar greater than 140.   lovastatin 20 MG tablet Commonly known as: MEVACOR Take by mouth.   multivitamin with minerals Tabs tablet Take 1 tablet by mouth daily. Multivitamins for Senior 50+   omeprazole 20 MG capsule Commonly known as: PRILOSEC Take 20 mg by mouth daily.   oxyCODONE 5 MG immediate release tablet Commonly  known as: Oxy IR/ROXICODONE Take 1-2 tablets (5-10 mg total) by mouth every 3 (three) hours as needed for breakthrough pain.   PRESERVISION AREDS 2 PO Take 1 tablet by mouth daily.   ramipril 10 MG capsule Commonly known as: ALTACE Take 10 mg by mouth 2 (two) times daily.   tetrahydrozoline 0.05 % ophthalmic solution Place 1 drop into both eyes 2 (two) times daily as needed (for scratchy eyes).   tetrahydrozoline-zinc 0.05-0.25 % ophthalmic solution Commonly known as: VISINE-AC 2 drops 3 (three) times daily as needed.   traMADol 50 MG tablet Commonly known as: ULTRAM Take 50 mg by mouth 5 (five) times daily as needed. For back pain.       Allergies:  Allergies  Allergen Reactions  . Bee Venom  Shortness Of Breath    Difficulty breathing   . Chlorthalidone Other (See Comments)    Unsure of reaction    Family History: Family History  Problem Relation Age of Onset  . Cancer Father   . Cancer Mother     Social History:  reports that he quit smoking about 17 years ago. His smoking use included cigarettes. His smokeless tobacco use includes chew. He reports current alcohol use. He reports that he does not use drugs.  ROS: UROLOGY Frequent Urination?: No Hard to postpone urination?: No Burning/pain with urination?: No Get up at night to urinate?: Yes Leakage of urine?: No Urine stream starts and stops?: No Trouble starting stream?: No Do you have to strain to urinate?: No Blood in urine?: No Urinary tract infection?: No Sexually transmitted disease?: No Injury to kidneys or bladder?: No Painful intercourse?: No Weak stream?: No Erection problems?: Yes Penile pain?: No  Gastrointestinal Nausea?: No Vomiting?: No Indigestion/heartburn?: No Diarrhea?: No Constipation?: No  Constitutional Fever: No Night sweats?: No Weight loss?: No Fatigue?: No  Skin Skin rash/lesions?: No Itching?: No  Eyes Blurred vision?: No Double vision?: No  Ears/Nose/Throat Sore throat?: No Sinus problems?: Yes  Hematologic/Lymphatic Swollen glands?: No Easy bruising?: No  Cardiovascular Leg swelling?: No Chest pain?: No  Respiratory Cough?: No Shortness of breath?: No  Endocrine Excessive thirst?: No  Musculoskeletal Back pain?: Yes Joint pain?: No  Neurological Headaches?: No Dizziness?: No  Psychologic Depression?: No Anxiety?: No  Physical Exam: BP 138/74 (BP Location: Left Arm, Patient Position: Sitting, Cuff Size: Normal)   Pulse 81   Ht 5\' 10"  (1.778 m)   Wt 179 lb 4.8 oz (81.3 kg)   BMI 25.73 kg/m   Constitutional:  Alert and oriented, No acute distress. HEENT: [place] AT, moist mucus membranes.  Trachea midline, no masses. Cardiovascular: No  clubbing, cyanosis, or edema. Respiratory: Normal respiratory effort, no increased work of breathing. Skin: No rashes, bruises or suspicious lesions. Neurologic: Grossly intact, no focal deficits, moving all 4 extremities. Psychiatric: Normal mood and affect.   Assessment & Plan:    - History prostate cancer PSA last year slightly above baseline.  Will recheck today.  Continue annual follow-up.  - History nephrolithiasis No recent imaging.  KUB was ordered and he will be notified with the results.   Abbie Sons, [place] [address] [place], [place] [address] [phone]

## 2019-09-16 LAB — PSA: Prostate Specific Ag, Serum: 1.3 ng/mL (ref 0.0–4.0)

## 2019-09-17 ENCOUNTER — Other Ambulatory Visit: Payer: Self-pay | Admitting: Urology

## 2019-09-17 DIAGNOSIS — Z87442 Personal history of urinary calculi: Secondary | ICD-10-CM

## 2019-09-18 ENCOUNTER — Telehealth: Payer: Self-pay

## 2019-09-18 NOTE — Telephone Encounter (Signed)
Called pt, line rang busy. 1st attempt   PSA stable at 1.3

## 2019-09-18 NOTE — Telephone Encounter (Signed)
-----   Message from Abbie Sons, MD sent at 09/17/2019 12:45 PM EDT ----- KUB reviewed and he may have a left sided stone.  Recommend scheduling a renal ultrasound.  Will call with results.

## 2019-09-19 NOTE — Telephone Encounter (Signed)
Called pt no answer. Left detailed message for pt per DPR.  ?

## 2019-11-06 DIAGNOSIS — I10 Essential (primary) hypertension: Secondary | ICD-10-CM | POA: Diagnosis not present

## 2019-11-06 DIAGNOSIS — E1122 Type 2 diabetes mellitus with diabetic chronic kidney disease: Secondary | ICD-10-CM | POA: Diagnosis not present

## 2019-11-06 DIAGNOSIS — I2510 Atherosclerotic heart disease of native coronary artery without angina pectoris: Secondary | ICD-10-CM | POA: Diagnosis not present

## 2019-11-06 DIAGNOSIS — E782 Mixed hyperlipidemia: Secondary | ICD-10-CM | POA: Diagnosis not present

## 2019-11-06 DIAGNOSIS — N183 Chronic kidney disease, stage 3 unspecified: Secondary | ICD-10-CM | POA: Diagnosis not present

## 2019-11-22 ENCOUNTER — Other Ambulatory Visit: Payer: Self-pay

## 2019-11-22 ENCOUNTER — Ambulatory Visit
Admission: RE | Admit: 2019-11-22 | Discharge: 2019-11-22 | Disposition: A | Discharge status: Home / Self Care | Payer: PPO | Source: Ambulatory Visit | Attending: Urology | Admitting: Urology

## 2019-11-22 DIAGNOSIS — N281 Cyst of kidney, acquired: Secondary | ICD-10-CM | POA: Insufficient documentation

## 2019-11-22 DIAGNOSIS — Z87442 Personal history of urinary calculi: Secondary | ICD-10-CM

## 2019-11-23 ENCOUNTER — Telehealth: Payer: Self-pay

## 2019-11-23 NOTE — Telephone Encounter (Signed)
Called pt no answer,  LM for pt informing him of the information below per DPR.

## 2019-11-23 NOTE — Telephone Encounter (Signed)
-----   Message from Abbie Sons, MD sent at 11/23/2019  1:08 PM EST ----- Renal ultrasound reviewed.  He has a probable stone in the upper portion of the left kidney which is not obstructing and can be monitored.  He also has small renal cysts.  Follow-up as scheduled.

## 2019-11-28 DIAGNOSIS — E1122 Type 2 diabetes mellitus with diabetic chronic kidney disease: Secondary | ICD-10-CM | POA: Diagnosis not present

## 2019-11-28 DIAGNOSIS — N183 Chronic kidney disease, stage 3 unspecified: Secondary | ICD-10-CM | POA: Diagnosis not present

## 2019-11-30 IMAGING — CR DG ABDOMEN 1V
1 series · 1 of 1 positions shown · non-contrast
Comparison: Abdominal radiograph dated 02/14/2014 and CT abdomen
dated 01/02/2010.

CLINICAL DATA: History of nephrolithiasis

EXAM:
ABDOMEN - 1 VIEW

[abdomen kub]
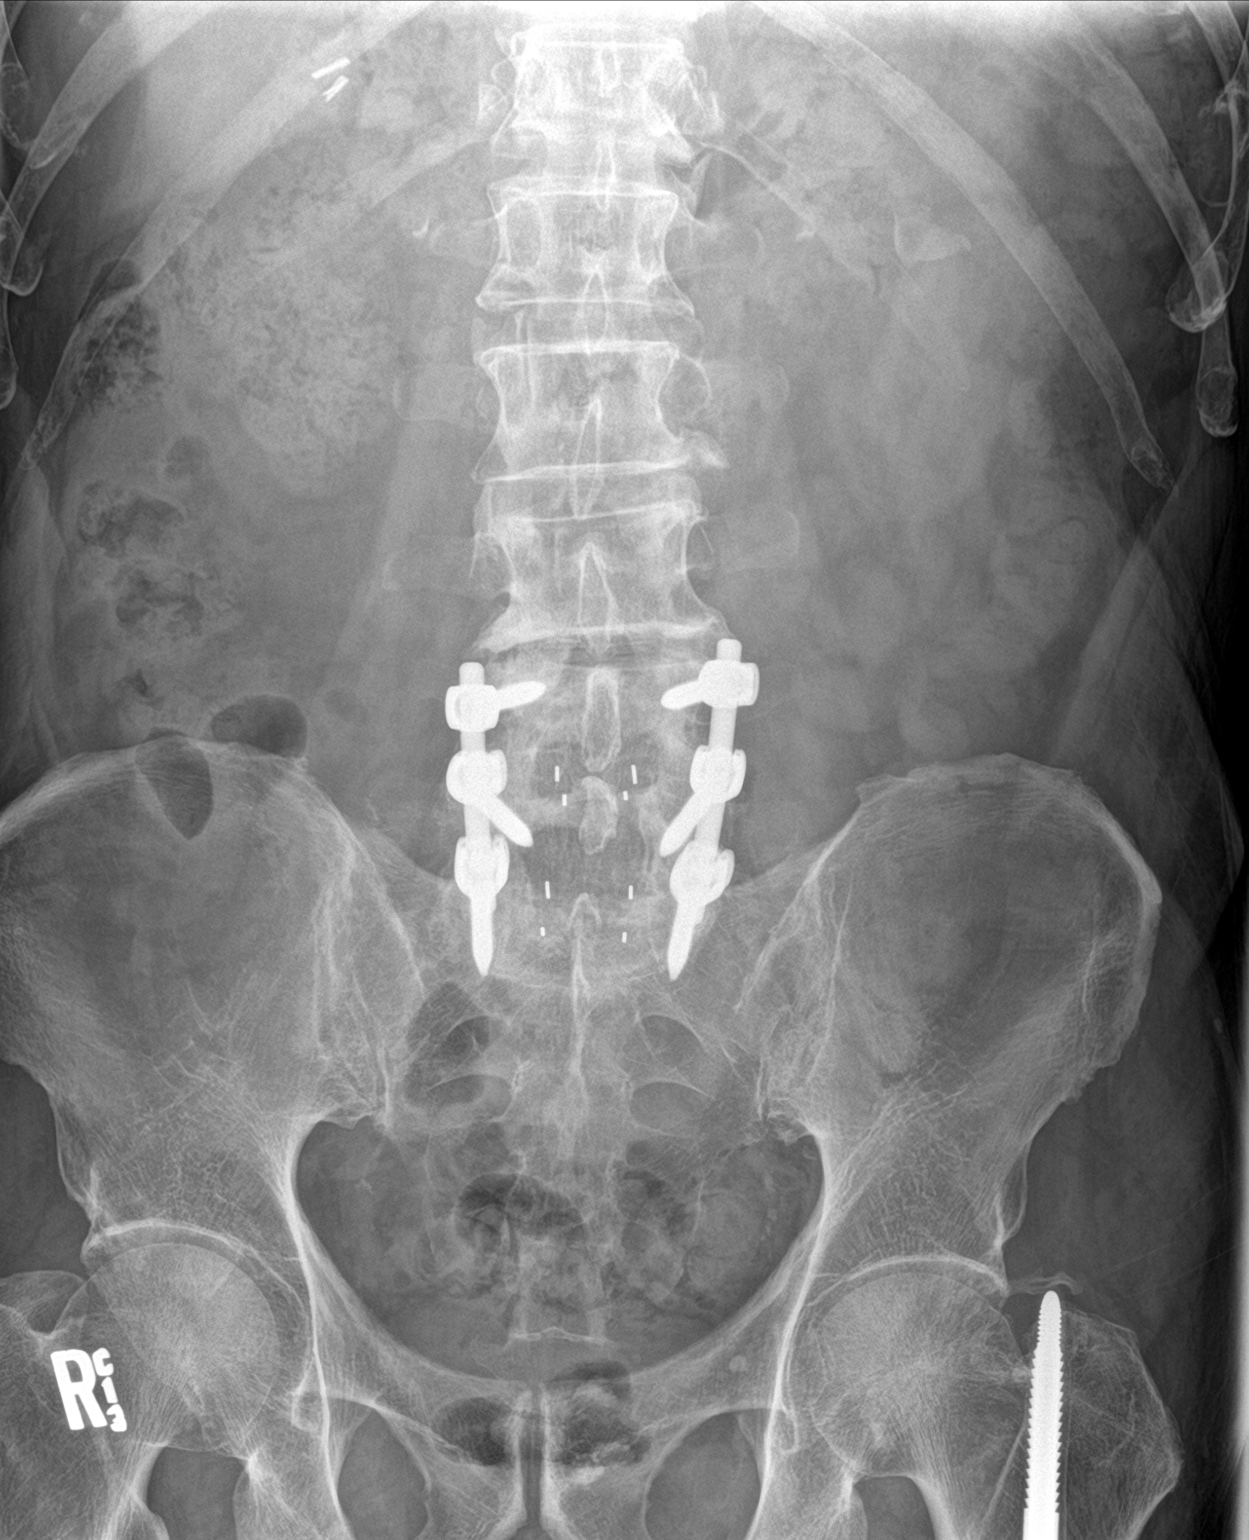

[1 of 1 positions shown; findings below may reference images not displayed]

FINDINGS: The bowel gas pattern is normal. No radio-opaque calculi or other
significant radiographic abnormality are seen. Phleboliths are seen
in the pelvis. Spinal fusion hardware with intervertebral disc
spacers are noted in the lumbar spine. Cholecystectomy clips are
noted. A fixation screw overlies the left proximal femur.
IMPRESSION: No evidence of nephrolithiasis.

## 2019-12-05 DIAGNOSIS — N183 Chronic kidney disease, stage 3 unspecified: Secondary | ICD-10-CM | POA: Diagnosis not present

## 2019-12-05 DIAGNOSIS — I10 Essential (primary) hypertension: Secondary | ICD-10-CM | POA: Diagnosis not present

## 2019-12-05 DIAGNOSIS — E1122 Type 2 diabetes mellitus with diabetic chronic kidney disease: Secondary | ICD-10-CM | POA: Diagnosis not present

## 2019-12-05 DIAGNOSIS — J449 Chronic obstructive pulmonary disease, unspecified: Secondary | ICD-10-CM | POA: Diagnosis not present

## 2019-12-05 DIAGNOSIS — K219 Gastro-esophageal reflux disease without esophagitis: Secondary | ICD-10-CM | POA: Diagnosis not present

## 2020-01-01 DIAGNOSIS — D225 Melanocytic nevi of trunk: Secondary | ICD-10-CM | POA: Diagnosis not present

## 2020-01-01 DIAGNOSIS — D1801 Hemangioma of skin and subcutaneous tissue: Secondary | ICD-10-CM | POA: Diagnosis not present

## 2020-01-01 DIAGNOSIS — L814 Other melanin hyperpigmentation: Secondary | ICD-10-CM | POA: Diagnosis not present

## 2020-01-01 DIAGNOSIS — D485 Neoplasm of uncertain behavior of skin: Secondary | ICD-10-CM | POA: Diagnosis not present

## 2020-01-01 DIAGNOSIS — Z1283 Encounter for screening for malignant neoplasm of skin: Secondary | ICD-10-CM | POA: Diagnosis not present

## 2020-01-01 DIAGNOSIS — L821 Other seborrheic keratosis: Secondary | ICD-10-CM | POA: Diagnosis not present

## 2020-01-01 DIAGNOSIS — L578 Other skin changes due to chronic exposure to nonionizing radiation: Secondary | ICD-10-CM | POA: Diagnosis not present

## 2020-02-06 ENCOUNTER — Other Ambulatory Visit: Payer: Self-pay | Admitting: Neurosurgery

## 2020-02-06 DIAGNOSIS — K645 Perianal venous thrombosis: Secondary | ICD-10-CM | POA: Diagnosis not present

## 2020-02-06 DIAGNOSIS — G8929 Other chronic pain: Secondary | ICD-10-CM

## 2020-02-06 DIAGNOSIS — M5442 Lumbago with sciatica, left side: Secondary | ICD-10-CM | POA: Diagnosis not present

## 2020-02-06 DIAGNOSIS — M5441 Lumbago with sciatica, right side: Secondary | ICD-10-CM | POA: Diagnosis not present

## 2020-02-17 ENCOUNTER — Other Ambulatory Visit: Payer: Self-pay

## 2020-02-17 ENCOUNTER — Ambulatory Visit
Admission: RE | Admit: 2020-02-17 | Discharge: 2020-02-17 | Disposition: A | Discharge status: Home / Self Care | Payer: PPO | Source: Ambulatory Visit | Attending: Neurosurgery | Admitting: Neurosurgery

## 2020-02-17 DIAGNOSIS — M5126 Other intervertebral disc displacement, lumbar region: Secondary | ICD-10-CM | POA: Diagnosis not present

## 2020-02-17 DIAGNOSIS — M5442 Lumbago with sciatica, left side: Secondary | ICD-10-CM | POA: Insufficient documentation

## 2020-02-17 DIAGNOSIS — M5441 Lumbago with sciatica, right side: Secondary | ICD-10-CM | POA: Insufficient documentation

## 2020-02-17 DIAGNOSIS — G8929 Other chronic pain: Secondary | ICD-10-CM | POA: Insufficient documentation

## 2020-02-17 DIAGNOSIS — M48061 Spinal stenosis, lumbar region without neurogenic claudication: Secondary | ICD-10-CM | POA: Diagnosis not present

## 2020-02-17 LAB — POCT I-STAT CREATININE: Creatinine, Ser: 1 mg/dL (ref 0.61–1.24)

## 2020-02-17 MED ORDER — GADOBUTROL 1 MMOL/ML IV SOLN
8.0000 mL | Freq: Once | INTRAVENOUS | Status: AC | PRN
  Administered 2020-02-17: 8 mL via INTRAVENOUS

## 2020-02-22 DIAGNOSIS — K645 Perianal venous thrombosis: Secondary | ICD-10-CM | POA: Diagnosis not present

## 2020-02-27 DIAGNOSIS — Z6825 Body mass index (BMI) 25.0-25.9, adult: Secondary | ICD-10-CM | POA: Diagnosis not present

## 2020-02-27 DIAGNOSIS — I10 Essential (primary) hypertension: Secondary | ICD-10-CM | POA: Diagnosis not present

## 2020-02-27 DIAGNOSIS — M5136 Other intervertebral disc degeneration, lumbar region: Secondary | ICD-10-CM | POA: Diagnosis not present

## 2020-02-28 DIAGNOSIS — R040 Epistaxis: Secondary | ICD-10-CM | POA: Diagnosis not present

## 2020-03-21 DIAGNOSIS — J301 Allergic rhinitis due to pollen: Secondary | ICD-10-CM | POA: Diagnosis not present

## 2020-03-21 DIAGNOSIS — R040 Epistaxis: Secondary | ICD-10-CM | POA: Diagnosis not present

## 2020-04-29 DIAGNOSIS — H353132 Nonexudative age-related macular degeneration, bilateral, intermediate dry stage: Secondary | ICD-10-CM | POA: Diagnosis not present

## 2020-04-29 DIAGNOSIS — E119 Type 2 diabetes mellitus without complications: Secondary | ICD-10-CM | POA: Diagnosis not present

## 2020-05-08 DIAGNOSIS — E782 Mixed hyperlipidemia: Secondary | ICD-10-CM | POA: Diagnosis not present

## 2020-05-08 DIAGNOSIS — I2510 Atherosclerotic heart disease of native coronary artery without angina pectoris: Secondary | ICD-10-CM | POA: Diagnosis not present

## 2020-05-08 DIAGNOSIS — I10 Essential (primary) hypertension: Secondary | ICD-10-CM | POA: Diagnosis not present

## 2020-07-15 DIAGNOSIS — E1122 Type 2 diabetes mellitus with diabetic chronic kidney disease: Secondary | ICD-10-CM | POA: Diagnosis not present

## 2020-07-15 DIAGNOSIS — N183 Chronic kidney disease, stage 3 unspecified: Secondary | ICD-10-CM | POA: Diagnosis not present

## 2020-07-22 DIAGNOSIS — J449 Chronic obstructive pulmonary disease, unspecified: Secondary | ICD-10-CM | POA: Diagnosis not present

## 2020-07-22 DIAGNOSIS — I2510 Atherosclerotic heart disease of native coronary artery without angina pectoris: Secondary | ICD-10-CM | POA: Diagnosis not present

## 2020-07-22 DIAGNOSIS — Z0001 Encounter for general adult medical examination with abnormal findings: Secondary | ICD-10-CM | POA: Diagnosis not present

## 2020-07-22 DIAGNOSIS — E782 Mixed hyperlipidemia: Secondary | ICD-10-CM | POA: Diagnosis not present

## 2020-07-22 DIAGNOSIS — N183 Chronic kidney disease, stage 3 unspecified: Secondary | ICD-10-CM | POA: Diagnosis not present

## 2020-07-22 DIAGNOSIS — I10 Essential (primary) hypertension: Secondary | ICD-10-CM | POA: Diagnosis not present

## 2020-07-22 DIAGNOSIS — E1122 Type 2 diabetes mellitus with diabetic chronic kidney disease: Secondary | ICD-10-CM | POA: Diagnosis not present

## 2020-09-19 NOTE — Progress Notes (Signed)
09/20/2020 10:18 AM   Steven Wolfe June 08, 1936 [id number]  Referring provider: Baxter Hire, MD [place],  [place] [address] Chief Complaint  Patient presents with  . Prostate Cancer    Urologic history:  1.Prostate cancer -Prostate cryoablation November 2006; Gleason score and PSA at time of diagnosis not available on record review  2.History nephrolithiasis  HPI: Steven Wolfe is a 84 y.o. male who returns for a 1 year follow up of history of prostate cancer and nephrolithiasis.   -Since last visit the patient has been doing well.  -No bothersome urinary symptoms. -No hematuria or dysuria. -No flank, abdominal or pelvic pain. -No stone episodes.  -Reports back pain.  -PSA 1.3 on 09/15/2019. -Renal US on 11/22/2019 noted suspected left upper pole renal calculus. No hydronephrosis. Small renal cysts.  PSA trend: Component     Latest Ref Rng & Units 08/24/2018 09/15/2019  Prostate Specific Ag, Serum     0.0 - 4.0 ng/mL 1.1 1.3     PMH: Past Medical History:  Diagnosis Date  . Arthritis   . Bulging lumbar disc   . Cancer ([place]) 02/24/2017   prostate  . Chronic sinusitis   . COPD (chronic obstructive pulmonary disease) ([place])   . Coronary artery disease   . Degeneration, intervertebral disc, lumbosacral   . Diabetes mellitus without complication ([place])   . GERD (gastroesophageal reflux disease)   . Hemorrhoids   . History of kidney stones   . History of kidney stones   . Hyperlipemia   . Hypertension   . Lumbar radiculitis   . Lumbar stenosis with neurogenic claudication   . Prostate cancer [facility])     Surgical History: Past Surgical History:  Procedure Laterality Date  . CARDIAC CATHETERIZATION    . CHOLECYSTECTOMY  02/24/2017   20 years ago  . COLONOSCOPY    . COLONOSCOPY WITH PROPOFOL N/A 10/12/2018   Procedure: COLONOSCOPY WITH PROPOFOL;  Surgeon: Manya Silvas, MD;  Location: [facility] ENDOSCOPY;  Service: Endoscopy;   Laterality: N/A;  . FEMUR FRACTURE SURGERY  50 years ago  . FUNCTIONAL ENDOSCOPIC SINUS SURGERY    . LEG SURGERY Left    ORIF   . PROSTATE CRYOABLATION  2007    Home Medications:  Allergies as of 09/20/2020      Reactions   Bee Venom Shortness Of Breath   Difficulty breathing   Lovastatin    Other reaction(s): Abdominal Pain   Chlorthalidone Other (See Comments)   Unsure of reaction   Rosuvastatin    Other reaction(s): Abdominal Pain Patient stated it also made him really nervous       Medication List       Accurate as of September 20, 2020 10:18 AM. If you have any questions, ask your nurse or doctor.        STOP taking these medications   lovastatin 20 MG tablet Commonly known as: MEVACOR Stopped by: Steven Sons, MD   oxyCODONE 5 MG immediate release tablet Commonly known as: Oxy IR/ROXICODONE Stopped by: Steven Sons, MD     TAKE these medications   amLODipine 5 MG tablet Commonly known as: NORVASC Take 5 mg by mouth 2 (two) times daily.   cyclobenzaprine 10 MG tablet Commonly known as: FLEXERIL Take 1 tablet (10 mg total) by mouth 3 (three) times daily as needed for muscle spasms.   docusate sodium 100 MG capsule Commonly known as: COLACE Take 1 capsule (100 mg total) by  mouth 2 (two) times daily.   freestyle lancets 1 each by Other route once daily Dx E11.9   gabapentin 100 MG capsule Commonly known as: NEURONTIN   gemfibrozil 600 MG tablet Commonly known as: LOPID Take 600 mg by mouth 2 (two) times daily.   glimepiride 1 MG tablet Commonly known as: AMARYL Take 0.5 mg by mouth daily as needed. For blood sugar greater than 140.   multivitamin with minerals Tabs tablet Take 1 tablet by mouth daily. Multivitamins for Senior 50+   omeprazole 20 MG capsule Commonly known as: PRILOSEC Take 20 mg by mouth daily.   PRESERVISION AREDS 2 PO Take 1 tablet by mouth daily.   ramipril 10 MG capsule Commonly known as: ALTACE Take 10 mg by mouth  2 (two) times daily.   tetrahydrozoline 0.05 % ophthalmic solution Place 1 drop into both eyes 2 (two) times daily as needed (for scratchy eyes).   tetrahydrozoline-zinc 0.05-0.25 % ophthalmic solution Commonly known as: VISINE-AC 2 drops 3 (three) times daily as needed.   traMADol 50 MG tablet Commonly known as: ULTRAM Take 50 mg by mouth 5 (five) times daily as needed. For back pain.       Allergies:  Allergies  Allergen Reactions  . Bee Venom Shortness Of Breath    Difficulty breathing   . Lovastatin     Other reaction(s): Abdominal Pain  . Chlorthalidone Other (See Comments)    Unsure of reaction  . Rosuvastatin     Other reaction(s): Abdominal Pain Patient stated it also made him really nervous     Family History: Family History  Problem Relation Age of Onset  . Cancer Father   . Cancer Mother     Social History:  reports that he quit smoking about 18 years ago. His smoking use included cigarettes. His smokeless tobacco use includes chew. He reports current alcohol use. He reports that he does not use drugs.   Physical Exam: BP (!) 141/70   Pulse 78   Ht 5\' 10"  (1.778 m)   Wt 180 lb (81.6 kg)   BMI 25.83 kg/m   Constitutional:  Alert and oriented, No acute distress. HEENT: [place] AT, moist mucus membranes.  Trachea midline, no masses. Cardiovascular: No clubbing, cyanosis, or edema. Respiratory: Normal respiratory effort, no increased work of breathing. Skin: No rashes, bruises or suspicious lesions. Neurologic: Grossly intact, no focal deficits, moving all 4 extremities. Psychiatric: Normal mood and affect.  Laboratory Data:  Lab Results  Component Value Date   CREATININE 1.00 02/17/2020     Pertinent Imaging:  Results for orders placed during the hospital encounter of 11/22/19  Ultrasound renal complete  Narrative CLINICAL DATA:  History of nephrolithiasis. History of prostate cancer.  EXAM: RENAL / URINARY TRACT ULTRASOUND  COMPLETE  COMPARISON:  Abdominal radiograph 09/15/2019. CT abdomen and pelvis 01/02/2010.  FINDINGS: Right Kidney:  Renal measurements: 10.8 x 5.7 x 4.9 cm = volume: 155 mL . Echogenicity within normal limits. No solid mass or hydronephrosis visualized. 5 mm upper pole cyst.  Left Kidney:  Renal measurements: 11.4 x 6.1 x 3.9 cm = volume: 142 mL. Echogenicity within normal limits. No mass or hydronephrosis visualized. 9 mm suspected cyst in the lower pole. 10 x 18 mm echogenic focus with posterior shadowing in the upper pole suggesting a nonobstructing calculus though none was visible on the abdominal radiograph from 2 months ago.  Bladder:  Appears normal for degree of bladder distention.  Other:  None.  IMPRESSION: 1. Suspected  left upper pole renal calculus. 2. No hydronephrosis. 3. Small renal cysts.   Electronically Signed By: Logan Bores M.D. On: 11/22/2019 16:24   I have personally reviewed the images and agree with radiologist interpretation.     Assessment & Plan:    1. History of prostate cancer PSA was 1.3 on 09/15/2019. Check PSA today and continue annual PSA.    2. History of nephrolithiasis   No recent stone episodes. Renal US on 11/22/2019 noted suspected left upper pole renal calculus. No hydronephrosis. Small renal cysts.   [facility] [address] [place], [place] [address] [phone]  I, Selena Batten, am acting as a scribe for Dr. Nicki Reaper C. Camauri Craton,  I have reviewed the above documentation for accuracy and completeness, and I agree with the above.   Steven Sons, MD

## 2020-09-20 ENCOUNTER — Encounter: Payer: Self-pay | Admitting: Urology

## 2020-09-20 ENCOUNTER — Ambulatory Visit (INDEPENDENT_AMBULATORY_CARE_PROVIDER_SITE_OTHER): Payer: PPO | Admitting: Urology

## 2020-09-20 ENCOUNTER — Other Ambulatory Visit: Payer: Self-pay

## 2020-09-20 VITALS — BP 141/70 | HR 78 | Ht 70.0 in | Wt 180.0 lb

## 2020-09-20 DIAGNOSIS — I10 Essential (primary) hypertension: Secondary | ICD-10-CM | POA: Diagnosis not present

## 2020-09-20 DIAGNOSIS — Z87442 Personal history of urinary calculi: Secondary | ICD-10-CM

## 2020-09-20 DIAGNOSIS — Z8546 Personal history of malignant neoplasm of prostate: Secondary | ICD-10-CM | POA: Diagnosis not present

## 2020-09-20 DIAGNOSIS — M5441 Lumbago with sciatica, right side: Secondary | ICD-10-CM | POA: Diagnosis not present

## 2020-09-20 DIAGNOSIS — G8929 Other chronic pain: Secondary | ICD-10-CM | POA: Diagnosis not present

## 2020-09-20 DIAGNOSIS — M5442 Lumbago with sciatica, left side: Secondary | ICD-10-CM | POA: Diagnosis not present

## 2020-09-21 LAB — PSA: Prostate Specific Ag, Serum: 1.3 ng/mL (ref 0.0–4.0)

## 2020-09-23 ENCOUNTER — Telehealth: Payer: Self-pay | Admitting: *Deleted

## 2020-09-23 NOTE — Telephone Encounter (Signed)
-----   Message from Abbie Sons, MD sent at 09/22/2020  2:29 PM EDT ----- PSA stable 1.3

## 2020-09-23 NOTE — Telephone Encounter (Signed)
Left message on phone per DPR  

## 2020-11-04 DIAGNOSIS — E782 Mixed hyperlipidemia: Secondary | ICD-10-CM | POA: Diagnosis not present

## 2020-11-04 DIAGNOSIS — I10 Essential (primary) hypertension: Secondary | ICD-10-CM | POA: Diagnosis not present

## 2020-11-04 DIAGNOSIS — E1122 Type 2 diabetes mellitus with diabetic chronic kidney disease: Secondary | ICD-10-CM | POA: Diagnosis not present

## 2020-11-04 DIAGNOSIS — I2510 Atherosclerotic heart disease of native coronary artery without angina pectoris: Secondary | ICD-10-CM | POA: Diagnosis not present

## 2020-11-04 DIAGNOSIS — N183 Chronic kidney disease, stage 3 unspecified: Secondary | ICD-10-CM | POA: Diagnosis not present

## 2020-12-16 DIAGNOSIS — M5416 Radiculopathy, lumbar region: Secondary | ICD-10-CM | POA: Diagnosis not present

## 2020-12-16 DIAGNOSIS — M5136 Other intervertebral disc degeneration, lumbar region: Secondary | ICD-10-CM | POA: Diagnosis not present

## 2020-12-16 DIAGNOSIS — M48062 Spinal stenosis, lumbar region with neurogenic claudication: Secondary | ICD-10-CM | POA: Diagnosis not present

## 2020-12-30 ENCOUNTER — Encounter: Payer: PPO | Admitting: Dermatology

## 2021-01-10 DIAGNOSIS — M5416 Radiculopathy, lumbar region: Secondary | ICD-10-CM | POA: Diagnosis not present

## 2021-01-10 DIAGNOSIS — M5136 Other intervertebral disc degeneration, lumbar region: Secondary | ICD-10-CM | POA: Diagnosis not present

## 2021-01-10 DIAGNOSIS — M48062 Spinal stenosis, lumbar region with neurogenic claudication: Secondary | ICD-10-CM | POA: Diagnosis not present

## 2021-01-13 ENCOUNTER — Ambulatory Visit: Payer: PPO | Admitting: Dermatology

## 2021-01-15 DIAGNOSIS — E1122 Type 2 diabetes mellitus with diabetic chronic kidney disease: Secondary | ICD-10-CM | POA: Diagnosis not present

## 2021-01-15 DIAGNOSIS — N183 Chronic kidney disease, stage 3 unspecified: Secondary | ICD-10-CM | POA: Diagnosis not present

## 2021-01-22 DIAGNOSIS — E782 Mixed hyperlipidemia: Secondary | ICD-10-CM | POA: Diagnosis not present

## 2021-01-22 DIAGNOSIS — Z0000 Encounter for general adult medical examination without abnormal findings: Secondary | ICD-10-CM | POA: Diagnosis not present

## 2021-01-22 DIAGNOSIS — N183 Chronic kidney disease, stage 3 unspecified: Secondary | ICD-10-CM | POA: Diagnosis not present

## 2021-01-22 DIAGNOSIS — I2510 Atherosclerotic heart disease of native coronary artery without angina pectoris: Secondary | ICD-10-CM | POA: Diagnosis not present

## 2021-01-22 DIAGNOSIS — Z125 Encounter for screening for malignant neoplasm of prostate: Secondary | ICD-10-CM | POA: Diagnosis not present

## 2021-01-22 DIAGNOSIS — E1122 Type 2 diabetes mellitus with diabetic chronic kidney disease: Secondary | ICD-10-CM | POA: Diagnosis not present

## 2021-01-22 DIAGNOSIS — C61 Malignant neoplasm of prostate: Secondary | ICD-10-CM | POA: Diagnosis not present

## 2021-01-22 DIAGNOSIS — J449 Chronic obstructive pulmonary disease, unspecified: Secondary | ICD-10-CM | POA: Diagnosis not present

## 2021-01-22 DIAGNOSIS — I10 Essential (primary) hypertension: Secondary | ICD-10-CM | POA: Diagnosis not present

## 2021-02-03 DIAGNOSIS — M5416 Radiculopathy, lumbar region: Secondary | ICD-10-CM | POA: Diagnosis not present

## 2021-02-03 DIAGNOSIS — M5136 Other intervertebral disc degeneration, lumbar region: Secondary | ICD-10-CM | POA: Diagnosis not present

## 2021-02-03 DIAGNOSIS — M48062 Spinal stenosis, lumbar region with neurogenic claudication: Secondary | ICD-10-CM | POA: Diagnosis not present

## 2021-02-12 ENCOUNTER — Ambulatory Visit: Payer: PPO | Admitting: Dermatology

## 2021-03-17 DIAGNOSIS — M5416 Radiculopathy, lumbar region: Secondary | ICD-10-CM | POA: Diagnosis not present

## 2021-03-17 DIAGNOSIS — M48062 Spinal stenosis, lumbar region with neurogenic claudication: Secondary | ICD-10-CM | POA: Diagnosis not present

## 2021-03-17 DIAGNOSIS — M5136 Other intervertebral disc degeneration, lumbar region: Secondary | ICD-10-CM | POA: Diagnosis not present

## 2021-05-13 DIAGNOSIS — H353132 Nonexudative age-related macular degeneration, bilateral, intermediate dry stage: Secondary | ICD-10-CM | POA: Diagnosis not present

## 2021-07-08 ENCOUNTER — Ambulatory Visit: Payer: PPO | Admitting: Dermatology

## 2021-07-08 ENCOUNTER — Other Ambulatory Visit: Payer: Self-pay

## 2021-07-08 DIAGNOSIS — D1800 Hemangioma unspecified site: Secondary | ICD-10-CM

## 2021-07-08 DIAGNOSIS — L821 Other seborrheic keratosis: Secondary | ICD-10-CM

## 2021-07-08 DIAGNOSIS — Z1283 Encounter for screening for malignant neoplasm of skin: Secondary | ICD-10-CM | POA: Diagnosis not present

## 2021-07-08 DIAGNOSIS — D224 Melanocytic nevi of scalp and neck: Secondary | ICD-10-CM

## 2021-07-08 DIAGNOSIS — L818 Other specified disorders of pigmentation: Secondary | ICD-10-CM | POA: Diagnosis not present

## 2021-07-08 DIAGNOSIS — L578 Other skin changes due to chronic exposure to nonionizing radiation: Secondary | ICD-10-CM | POA: Diagnosis not present

## 2021-07-08 DIAGNOSIS — D489 Neoplasm of uncertain behavior, unspecified: Secondary | ICD-10-CM

## 2021-07-08 DIAGNOSIS — D229 Melanocytic nevi, unspecified: Secondary | ICD-10-CM | POA: Diagnosis not present

## 2021-07-08 DIAGNOSIS — L814 Other melanin hyperpigmentation: Secondary | ICD-10-CM | POA: Diagnosis not present

## 2021-07-08 DIAGNOSIS — D485 Neoplasm of uncertain behavior of skin: Secondary | ICD-10-CM | POA: Diagnosis not present

## 2021-07-08 DIAGNOSIS — L988 Other specified disorders of the skin and subcutaneous tissue: Secondary | ICD-10-CM | POA: Diagnosis not present

## 2021-07-08 DIAGNOSIS — L820 Inflamed seborrheic keratosis: Secondary | ICD-10-CM | POA: Diagnosis not present

## 2021-07-08 NOTE — Patient Instructions (Addendum)
Biopsy Wound Care Instructions  Leave the original bandage on for 24 hours if possible.  If the bandage becomes soaked or soiled before that time, it is OK to remove it and examine the wound.  A small amount of post-operative bleeding is normal.  If excessive bleeding occurs, remove the bandage, place gauze over the site and apply continuous pressure (no peeking) over the area for 30 minutes. If this does not work, please call our clinic as soon as possible or page your doctor if it is after hours.   Once a day, cleanse the wound with soap and water. It is fine to shower. If a thick crust develops you may use a Q-tip dipped into dilute hydrogen peroxide (mix 1:1 with water) to dissolve it.  Hydrogen peroxide can slow the healing process, so use it only as needed.    After washing, apply petroleum jelly (Vaseline) or an antibiotic ointment if your doctor prescribed one for you, followed by a bandage.    For best healing, the wound should be covered with a layer of ointment at all times. If you are not able to keep the area covered with a bandage to hold the ointment in place, this may mean re-applying the ointment several times a day.  Continue this wound care until the wound has healed and is no longer open.   Itching and mild discomfort is normal during the healing process. However, if you develop pain or severe itching, please call our office.   If you have any discomfort, you can take Tylenol (acetaminophen) or ibuprofen as directed on the bottle. (Please do not take these if you have an allergy to them or cannot take them for another reason).  Some redness, tenderness and white or yellow material in the wound is normal healing.  If the area becomes very sore and red, or develops a thick yellow-green material (pus), it may be infected; please notify us.    If you have stitches, return to clinic as directed to have the stitches removed. You will continue wound care for 2-3 days after the stitches  are removed.   Wound healing continues for up to one year following surgery. It is not unusual to experience pain in the scar from time to time during the interval.  If the pain becomes severe or the scar thickens, you should notify the office.    A slight amount of redness in a scar is expected for the first six months.  After six months, the redness will fade and the scar will soften and fade.  The color difference becomes less noticeable with time.  If there are any problems, return for a post-op surgery check at your earliest convenience.  To improve the appearance of the scar, you can use silicone scar gel, cream, or sheets (such as Mederma or Serica) every night for up to one year. These are available over the counter (without a prescription).  Please call our office at [phone] for any questions or concerns.       Melanoma ABCDEs  Melanoma is the most dangerous type of skin cancer, and is the leading cause of death from skin disease.  You are more likely to develop melanoma if you: Have light-colored skin, light-colored eyes, or red or blond hair Spend a lot of time in the sun Tan regularly, either outdoors or in a tanning bed Have had blistering sunburns, especially during childhood Have a close family member who has had a melanoma Have atypical moles  or large birthmarks  Early detection of melanoma is key since treatment is typically straightforward and cure rates are extremely high if we catch it early.   The first sign of melanoma is often a change in a mole or a new dark spot.  The ABCDE system is a way of remembering the signs of melanoma.  A for asymmetry:  The two halves do not match. B for border:  The edges of the growth are irregular. C for color:  A mixture of colors are present instead of an even brown color. D for diameter:  Melanomas are usually (but not always) greater than 7m - the size of a pencil eraser. E for evolution:  The spot keeps changing in  size, shape, and color.  Please check your skin once per month between visits. You can use a small mirror in front and a large mirror behind you to keep an eye on the back side or your body.   If you see any new or changing lesions before your next follow-up, please call to schedule a visit.  Please continue daily skin protection including broad spectrum sunscreen SPF 30+ to sun-exposed areas, reapplying every 2 hours as needed when you're outdoors.   Staying in the shade or wearing long sleeves, sun glasses (UVA+UVB protection) and wide brim hats (4-inch brim around the entire circumference of the hat) are also recommended for sun protection.       If you have any questions or concerns for your doctor, please call our main line at [phone] press option 4 to reach your doctor's medical assistant. If no one answers, please leave a voicemail as directed and we will return your call as soon as possible. Messages left after 4 pm will be answered the following business day.   You may also send [place] message via [place] We typically respond to MyChart messages within 1-2 business days.  For prescription refills, please ask your pharmacy to contact our office. Our fax number is [phone]  If you have an urgent issue when the clinic is closed that cannot wait until the next business day, you can page your doctor at the number below.    Please note that while we do our best to be available for urgent issues outside of office hours, we are not available 24/7.   If you have an urgent issue and are unable to reach [place] you may choose to seek medical care at your doctor's office, retail clinic, urgent care center, or emergency room.  If you have a medical emergency, please immediately call 911 or go to the emergency department.  Pager Numbers  - Dr. KNehemiah Massed [phone] - Dr. MLaurence Ferrari [phone] - Dr. SNicole Kindred [phone] In the event of inclement weather, please call our main  line at [phone] an update on the status of any delays or closures.  Dermatology Medication Tips: Please keep the boxes that topical medications come in in order to help keep track of the instructions about where and how to use these. Pharmacies typically print the medication instructions only on the boxes and not directly on the medication tubes.   If your medication is too expensive, please contact our office at [phone] 4 or send [place] message through [place]   We are unable to tell what your co-pay for medications will be in advance as this is different depending on your insurance coverage. However, we may be able to find a substitute medication at lower  cost or fill out paperwork to get insurance to cover a needed medication.   If a prior authorization is required to get your medication covered by your insurance company, please allow [place] 1-2 business days to complete this process.  Drug prices often vary depending on where the prescription is filled and some pharmacies may offer cheaper prices.  The website www.goodrx.com contains coupons for medications through different pharmacies. The prices here do not account for what the cost may be with help from insurance (it may be cheaper with your insurance), but the website can give you the price if you did not use any insurance.  - You can print the associated coupon and take it with your prescription to the pharmacy.  - You may also stop by our office during regular business hours and pick up a GoodRx coupon card.  - If you need your prescription sent electronically to a different pharmacy, notify our office through [facility] or by phone at [phone] option 4.

## 2021-07-08 NOTE — Progress Notes (Signed)
Follow-Up Visit   Subjective  Steven Wolfe is a 85 y.o. male who presents for the following: Annual Exam (Patient here for full body exam. Patient states he would like a spot on back check that is rough and scaly. He reports it has been there for a while. He also would like a spot at neck checked he noticed about 3 - 4 months ago.   He reports no other new concerns. He has no history of skin cancer. ).  Spot on neck gets irritated and sometimes will scab and white stuff comes out.  Also mole on back of neck gets irritated at times by collar.  He has some scaly spots on face that keep coming back.  Patient here for full body skin exam and skin cancer screening.   The following portions of the chart were reviewed this encounter and updated as appropriate:       Objective  Well appearing patient in no apparent distress; mood and affect are within normal limits.  A full examination was performed including scalp, head, eyes, ears, nose, lips, neck, chest, axillae, abdomen, back, buttocks, bilateral upper extremities, bilateral lower extremities, hands, feet, fingers, toes, fingernails, and toenails. All findings within normal limits unless otherwise noted below.  right lateral neck 7 mm pink scaly papule      Neck - Posterior 4 mm fleshy papule   Right Malar Cheek x 1, right upper forehead x 1 right preauricular x 1, right spinal midlower back x 1 (4) Erythematous keratotic or waxy stuck-on papule    Left Lower Leg - Anterior, Right Lower Leg - Anterior Stasis changes bilateral legs with small yellow/brown hyperpigmented macules/patches  Assessment & Plan  Neoplasm of uncertain behavior (2) right lateral neck  Skin / nail biopsy Type of biopsy: tangential   Informed consent: discussed and consent obtained   Patient was prepped and draped in usual sterile fashion: Area prepped with alcohol. Anesthesia: the lesion was anesthetized in a standard fashion   Anesthetic:  1%  lidocaine w/ epinephrine 1-100,000 buffered w/ 8.4% NaHCO3 Instrument used: flexible razor blade   Hemostasis achieved with: pressure, aluminum chloride and electrodesiccation   Outcome: patient tolerated procedure well   Post-procedure details: wound care instructions given   Post-procedure details comment:  Ointment and small bandage applied  Specimen 1 - Surgical pathology Differential Diagnosis: r/o scc  Check Margins: No  7 mm pink scaly papule   Neck - Posterior  Skin / nail biopsy Type of biopsy: tangential   Informed consent: discussed and consent obtained   Patient was prepped and draped in usual sterile fashion: Area prepped with alcohol. Anesthesia: the lesion was anesthetized in a standard fashion   Anesthetic:  1% lidocaine w/ epinephrine 1-100,000 buffered w/ 8.4% NaHCO3 Instrument used: flexible razor blade   Hemostasis achieved with: pressure, aluminum chloride and electrodesiccation   Outcome: patient tolerated procedure well   Post-procedure details: wound care instructions given   Post-procedure details comment:  Ointment and small bandage applied  Specimen 2 - Surgical pathology Differential Diagnosis: Irritated nevus vs other   Check Margins: No  4 mm fleshy papule   Isk vs cyst at right lateral neck, r/o SCC    R/o irritated nevus posterior neck     Inflamed seborrheic keratosis Right Malar Cheek x 1, right upper forehead x 1 right preauricular x 1, right spinal midlower back x 1     Destruction of lesion - Right Malar Cheek x 1, right upper  forehead x 1 right preauricular x 1, right spinal midlower back x 1  Destruction method: cryotherapy   Informed consent: discussed and consent obtained   Lesion destroyed using liquid nitrogen: Yes   Region frozen until ice ball extended beyond lesion: Yes   Outcome: patient tolerated procedure well with no complications   Post-procedure details: wound care instructions given   Additional details:   Prior to procedure, discussed risks of blister formation, small wound, skin dyspigmentation, or rare scar following cryotherapy. Recommend Vaseline ointment to treated areas while healing.   Stasis pigmentation of lower extremity (2) Left Lower Leg - Anterior; Right Lower Leg - Anterior  Benign  Stasis in the legs causes chronic leg swelling, which may result in itchy or painful rashes, skin discoloration, skin texture changes, and sometimes ulceration.  Recommend daily compression hose/stockings- easiest to put on first thing in morning, remove at bedtime.  Elevate legs as much as possible. Avoid salt/sodium rich foods.   Lentigines - Scattered tan macules - Due to sun exposure - Benign-appering, observe - Recommend daily broad spectrum sunscreen SPF 30+ to sun-exposed areas, reapply every 2 hours as needed. - Call for any changes  Seborrheic Keratoses - Stuck-on, waxy, tan-brown papules and/or plaques  - Benign-appearing - Discussed benign etiology and prognosis. - Observe - Call for any changes  Melanocytic Nevi - Tan-brown and/or pink-flesh-colored symmetric macules and papules - Benign appearing on exam today - Observation - Call clinic for new or changing moles - Recommend daily use of broad spectrum spf 30+ sunscreen to sun-exposed areas.   Hemangiomas - Red papules - Discussed benign nature - Observe - Call for any changes  Actinic Damage - Chronic condition, secondary to cumulative UV/sun exposure - diffuse scaly erythematous macules with underlying dyspigmentation - Recommend daily broad spectrum sunscreen SPF 30+ to sun-exposed areas, reapply every 2 hours as needed.  - Staying in the shade or wearing long sleeves, sun glasses (UVA+UVB protection) and wide brim hats (4-inch brim around the entire circumference of the hat) are also recommended for sun protection.  - Call for new or changing lesions.   Skin cancer screening performed today.  Return in about 1  year (around 07/08/2022) for tbse. I, Ruthell Rummage, CMA, am acting as scribe for Brendolyn Patty, MD.  Documentation: I have reviewed the above documentation for accuracy and completeness, and I agree with the above.  Brendolyn Patty MD

## 2021-07-16 ENCOUNTER — Telehealth: Payer: Self-pay

## 2021-07-16 NOTE — Telephone Encounter (Signed)
Left message for pt advising him both biopsies were benign.

## 2021-07-16 NOTE — Telephone Encounter (Signed)
-----   Message from Brendolyn Patty, MD sent at 07/16/2021 10:18 AM EDT ----- 1. Skin , right lateral neck WARTY DYSKERATOMA 2. Skin , neck - posterior MELANOCYTIC NEVUS, INTRADERMAL TYPE, BASE INVOLVED  1. Benign wart like growth 2. Benign mole  - please call patient

## 2021-07-21 DIAGNOSIS — E1122 Type 2 diabetes mellitus with diabetic chronic kidney disease: Secondary | ICD-10-CM | POA: Diagnosis not present

## 2021-07-21 DIAGNOSIS — N183 Chronic kidney disease, stage 3 unspecified: Secondary | ICD-10-CM | POA: Diagnosis not present

## 2021-07-23 DIAGNOSIS — M5416 Radiculopathy, lumbar region: Secondary | ICD-10-CM | POA: Diagnosis not present

## 2021-07-23 DIAGNOSIS — M48062 Spinal stenosis, lumbar region with neurogenic claudication: Secondary | ICD-10-CM | POA: Diagnosis not present

## 2021-07-28 DIAGNOSIS — Z0001 Encounter for general adult medical examination with abnormal findings: Secondary | ICD-10-CM | POA: Diagnosis not present

## 2021-07-28 DIAGNOSIS — E782 Mixed hyperlipidemia: Secondary | ICD-10-CM | POA: Diagnosis not present

## 2021-07-28 DIAGNOSIS — Z20822 Contact with and (suspected) exposure to covid-19: Secondary | ICD-10-CM | POA: Diagnosis not present

## 2021-07-28 DIAGNOSIS — E1122 Type 2 diabetes mellitus with diabetic chronic kidney disease: Secondary | ICD-10-CM | POA: Diagnosis not present

## 2021-07-28 DIAGNOSIS — J449 Chronic obstructive pulmonary disease, unspecified: Secondary | ICD-10-CM | POA: Diagnosis not present

## 2021-07-28 DIAGNOSIS — N183 Chronic kidney disease, stage 3 unspecified: Secondary | ICD-10-CM | POA: Diagnosis not present

## 2021-07-28 DIAGNOSIS — J329 Chronic sinusitis, unspecified: Secondary | ICD-10-CM | POA: Diagnosis not present

## 2021-07-28 DIAGNOSIS — I2510 Atherosclerotic heart disease of native coronary artery without angina pectoris: Secondary | ICD-10-CM | POA: Diagnosis not present

## 2021-07-28 DIAGNOSIS — I10 Essential (primary) hypertension: Secondary | ICD-10-CM | POA: Diagnosis not present

## 2021-07-28 DIAGNOSIS — K219 Gastro-esophageal reflux disease without esophagitis: Secondary | ICD-10-CM | POA: Diagnosis not present

## 2021-07-30 DIAGNOSIS — N183 Chronic kidney disease, stage 3 unspecified: Secondary | ICD-10-CM | POA: Diagnosis not present

## 2021-07-30 DIAGNOSIS — R9431 Abnormal electrocardiogram [ECG] [EKG]: Secondary | ICD-10-CM | POA: Diagnosis not present

## 2021-07-30 DIAGNOSIS — E782 Mixed hyperlipidemia: Secondary | ICD-10-CM | POA: Diagnosis not present

## 2021-07-30 DIAGNOSIS — I10 Essential (primary) hypertension: Secondary | ICD-10-CM | POA: Diagnosis not present

## 2021-07-30 DIAGNOSIS — E1122 Type 2 diabetes mellitus with diabetic chronic kidney disease: Secondary | ICD-10-CM | POA: Diagnosis not present

## 2021-07-30 DIAGNOSIS — I2510 Atherosclerotic heart disease of native coronary artery without angina pectoris: Secondary | ICD-10-CM | POA: Diagnosis not present

## 2021-09-02 DIAGNOSIS — I2510 Atherosclerotic heart disease of native coronary artery without angina pectoris: Secondary | ICD-10-CM | POA: Diagnosis not present

## 2021-09-02 DIAGNOSIS — R9431 Abnormal electrocardiogram [ECG] [EKG]: Secondary | ICD-10-CM | POA: Diagnosis not present

## 2021-09-11 DIAGNOSIS — I10 Essential (primary) hypertension: Secondary | ICD-10-CM | POA: Diagnosis not present

## 2021-09-11 DIAGNOSIS — I2510 Atherosclerotic heart disease of native coronary artery without angina pectoris: Secondary | ICD-10-CM | POA: Diagnosis not present

## 2021-09-11 DIAGNOSIS — E782 Mixed hyperlipidemia: Secondary | ICD-10-CM | POA: Diagnosis not present

## 2021-09-16 DIAGNOSIS — M5442 Lumbago with sciatica, left side: Secondary | ICD-10-CM | POA: Diagnosis not present

## 2021-09-16 DIAGNOSIS — G8929 Other chronic pain: Secondary | ICD-10-CM | POA: Diagnosis not present

## 2021-09-16 DIAGNOSIS — M5441 Lumbago with sciatica, right side: Secondary | ICD-10-CM | POA: Diagnosis not present

## 2021-09-16 DIAGNOSIS — I10 Essential (primary) hypertension: Secondary | ICD-10-CM | POA: Diagnosis not present

## 2021-09-22 DIAGNOSIS — M5416 Radiculopathy, lumbar region: Secondary | ICD-10-CM | POA: Diagnosis not present

## 2021-09-22 DIAGNOSIS — M48062 Spinal stenosis, lumbar region with neurogenic claudication: Secondary | ICD-10-CM | POA: Diagnosis not present

## 2021-09-22 DIAGNOSIS — M5136 Other intervertebral disc degeneration, lumbar region: Secondary | ICD-10-CM | POA: Diagnosis not present

## 2021-09-24 ENCOUNTER — Ambulatory Visit: Payer: PPO | Admitting: Urology

## 2021-09-24 ENCOUNTER — Ambulatory Visit
Admission: RE | Admit: 2021-09-24 | Discharge: 2021-09-24 | Disposition: A | Discharge status: Home / Self Care | Payer: PPO | Source: Ambulatory Visit | Attending: Urology | Admitting: Urology

## 2021-09-24 ENCOUNTER — Other Ambulatory Visit: Payer: Self-pay

## 2021-09-24 ENCOUNTER — Encounter: Payer: Self-pay | Admitting: Urology

## 2021-09-24 VITALS — BP 113/63 | HR 80 | Ht 70.0 in | Wt 183.0 lb

## 2021-09-24 DIAGNOSIS — Z87442 Personal history of urinary calculi: Secondary | ICD-10-CM | POA: Diagnosis not present

## 2021-09-24 DIAGNOSIS — N5082 Scrotal pain: Secondary | ICD-10-CM

## 2021-09-24 DIAGNOSIS — R109 Unspecified abdominal pain: Secondary | ICD-10-CM | POA: Diagnosis not present

## 2021-09-24 DIAGNOSIS — Z8546 Personal history of malignant neoplasm of prostate: Secondary | ICD-10-CM | POA: Diagnosis not present

## 2021-09-24 NOTE — Progress Notes (Signed)
09/24/2021 8:53 AM   Steven Wolfe 12/27/1935 [id number]  Referring provider: Baxter Hire, MD [place],  [place] [address]  Chief Complaint  Patient presents with   Prostate Cancer    Urologic history:   1.  Prostate cancer -Prostate cryoablation November 2006; Gleason score and PSA at time of diagnosis not available on record review   2.  History nephrolithiasis  HPI: 85 y.o. male presents for annual follow-up.  Doing well since last visit No bothersome LUTS Denies dysuria, gross hematuria Denies flank, abdominal or pelvic pain States today he has a 20-year history of right hemiscrotal pain that has worsened since his visit last year.  Symptoms are intermittent and usually worse with getting out of car or changes in position Does have lumbar disc disease Denies dysuria, gross hematuria No flank, abdominal or pelvic pain   PMH: Past Medical History:  Diagnosis Date   Arthritis    Bulging lumbar disc    Cancer ([place]) 02/24/2017   prostate   Chronic sinusitis    COPD (chronic obstructive pulmonary disease) (HCC)    Coronary artery disease    Degeneration, intervertebral disc, lumbosacral    Diabetes mellitus without complication (HCC)    GERD (gastroesophageal reflux disease)    Hemorrhoids    History of kidney stones    History of kidney stones    Hyperlipemia    Hypertension    Lumbar radiculitis    Lumbar stenosis with neurogenic claudication    Prostate cancer [facility])     Surgical History: Past Surgical History:  Procedure Laterality Date   CARDIAC CATHETERIZATION     CHOLECYSTECTOMY  02/24/2017   20 years ago   COLONOSCOPY     COLONOSCOPY WITH PROPOFOL N/A 10/12/2018   Procedure: COLONOSCOPY WITH PROPOFOL;  Surgeon: Manya Silvas, MD;  Location: [facility] ENDOSCOPY;  Service: Endoscopy;  Laterality: N/A;   FEMUR FRACTURE SURGERY  50 years ago   FUNCTIONAL ENDOSCOPIC SINUS SURGERY     LEG SURGERY Left    ORIF    PROSTATE  CRYOABLATION  2007    Home Medications:  Allergies as of 09/24/2021       Reactions   Bee Venom Shortness Of Breath   Difficulty breathing   Lovastatin    Other reaction(s): Abdominal Pain   Chlorthalidone Other (See Comments)   Unsure of reaction   Rosuvastatin    Other reaction(s): Abdominal Pain Patient stated it also made him really nervous         Medication List        Accurate as of September 24, 2021  8:53 AM. If you have any questions, ask your nurse or doctor.          amLODipine 5 MG tablet Commonly known as: NORVASC Take 5 mg by mouth 2 (two) times daily.   cyclobenzaprine 10 MG tablet Commonly known as: FLEXERIL Take 1 tablet (10 mg total) by mouth 3 (three) times daily as needed for muscle spasms.   docusate sodium 100 MG capsule Commonly known as: COLACE Take 1 capsule (100 mg total) by mouth 2 (two) times daily.   freestyle lancets 1 each by Other route once daily Dx E11.9   gabapentin 100 MG capsule Commonly known as: NEURONTIN   gemfibrozil 600 MG tablet Commonly known as: LOPID Take 600 mg by mouth 2 (two) times daily.   glimepiride 1 MG tablet Commonly known as: AMARYL Take 0.5 mg by mouth daily as needed. For blood  sugar greater than 140.   multivitamin with minerals Tabs tablet Take 1 tablet by mouth daily. Multivitamins for Senior 50+   omeprazole 20 MG capsule Commonly known as: PRILOSEC Take 20 mg by mouth daily.   PRESERVISION AREDS 2 PO Take 1 tablet by mouth daily.   ramipril 10 MG capsule Commonly known as: ALTACE Take 10 mg by mouth 2 (two) times daily.   tetrahydrozoline 0.05 % ophthalmic solution Place 1 drop into both eyes 2 (two) times daily as needed (for scratchy eyes).   tetrahydrozoline-zinc 0.05-0.25 % ophthalmic solution Commonly known as: VISINE-AC 2 drops 3 (three) times daily as needed.   traMADol 50 MG tablet Commonly known as: ULTRAM Take 50 mg by mouth 5 (five) times daily as needed. For back  pain.        Allergies:  Allergies  Allergen Reactions   Bee Venom Shortness Of Breath    Difficulty breathing    Lovastatin     Other reaction(s): Abdominal Pain   Chlorthalidone Other (See Comments)    Unsure of reaction   Rosuvastatin     Other reaction(s): Abdominal Pain Patient stated it also made him really nervous     Family History: Family History  Problem Relation Age of Onset   Cancer Father    Cancer Mother     Social History:  reports that he quit smoking about 19 years ago. His smoking use included cigarettes. His smokeless tobacco use includes chew. He reports current alcohol use. He reports that he does not use drugs.   Physical Exam: BP 113/63   Pulse 80   Ht 5\' 10"  (1.778 m)   Wt 183 lb (83 kg)   BMI 26.26 kg/m   Constitutional:  Alert and oriented, No acute distress. HEENT: [place] AT, moist mucus membranes.  Trachea midline, no masses. Cardiovascular: No clubbing, cyanosis, or edema. Respiratory: Normal respiratory effort, no increased work of breathing. GI: Abdomen is soft, nontender, nondistended, no abdominal masses GU: Phallus without lesions, testes atrophic with small right hydrocele.  Mild tenderness right testis without mass.  No palpable hernia Neurologic: Grossly intact, no focal deficits, moving all 4 extremities. Psychiatric: Normal mood and affect.   Assessment & Plan:    Right scrotal content pain Worsening He has a prior history of stone disease and although unlikely this would be referred pain from ureteral calculus a KUB was ordered.  We discussed possibility of neurogenic pain with his lumbar disc disease. If KUB negative will order a scrotal ultrasound  2.  History of prostate cancer 16 years status post cryoablation with stable PSA He has elected to discontinue annual PSA testing   Abbie Sons, MD  [facility] [address] [place], [place] [address] [phone]

## 2021-10-06 ENCOUNTER — Other Ambulatory Visit: Payer: Self-pay | Admitting: Urology

## 2021-10-06 DIAGNOSIS — N200 Calculus of kidney: Secondary | ICD-10-CM

## 2021-10-07 ENCOUNTER — Telehealth: Payer: Self-pay | Admitting: *Deleted

## 2021-10-07 NOTE — Telephone Encounter (Signed)
-----   Message from Abbie Sons, MD sent at 10/06/2021  9:40 AM EDT ----- KUB reviewed and no stone that would account for her scrotal pain was notified.  He may however have a large stone in the left kidney that is not well visualized and was not well visualized on a prior renal ultrasound.  Would recommend scheduling a stone protocol CT.  Order was placed and will call with results.

## 2021-10-08 ENCOUNTER — Ambulatory Visit
Admission: RE | Admit: 2021-10-08 | Discharge: 2021-10-08 | Disposition: A | Discharge status: Home / Self Care | Payer: PPO | Source: Ambulatory Visit | Attending: Urology | Admitting: Urology

## 2021-10-08 ENCOUNTER — Other Ambulatory Visit: Payer: Self-pay

## 2021-10-08 ENCOUNTER — Ambulatory Visit: Payer: PPO

## 2021-10-08 DIAGNOSIS — N503 Cyst of epididymis: Secondary | ICD-10-CM | POA: Diagnosis not present

## 2021-10-08 DIAGNOSIS — N5082 Scrotal pain: Secondary | ICD-10-CM | POA: Insufficient documentation

## 2021-10-09 ENCOUNTER — Telehealth: Payer: Self-pay | Admitting: *Deleted

## 2021-10-09 NOTE — Telephone Encounter (Signed)
-----   Message from Abbie Sons, MD sent at 10/09/2021 11:54 AM EDT ----- Scrotal ultrasound showed no significant abnormalities or findings that would account for his scrotal pain.  His pain may be nerve related due to lumbar disc disease

## 2021-10-09 NOTE — Telephone Encounter (Signed)
Scrotal ultrasound showed no significant abnormalities or findings that would account for his scrotal pain.  His pain may be nerve related due to lumbar disc disease per Dr. Bernardo Heater

## 2021-10-09 NOTE — Telephone Encounter (Signed)
Notified patient as instructed, patient pleased °

## 2021-10-16 ENCOUNTER — Other Ambulatory Visit: Payer: Self-pay

## 2021-10-16 ENCOUNTER — Ambulatory Visit
Admission: EM | Admit: 2021-10-16 | Discharge: 2021-10-16 | Disposition: A | Discharge status: Home / Self Care | Payer: PPO | Attending: Physician Assistant | Admitting: Physician Assistant

## 2021-10-16 ENCOUNTER — Ambulatory Visit
Admission: RE | Admit: 2021-10-16 | Discharge: 2021-10-16 | Disposition: A | Discharge status: Home / Self Care | Payer: PPO | Source: Ambulatory Visit | Attending: Urology | Admitting: Urology

## 2021-10-16 ENCOUNTER — Encounter: Payer: Self-pay | Admitting: Emergency Medicine

## 2021-10-16 DIAGNOSIS — J019 Acute sinusitis, unspecified: Secondary | ICD-10-CM

## 2021-10-16 DIAGNOSIS — K5730 Diverticulosis of large intestine without perforation or abscess without bleeding: Secondary | ICD-10-CM | POA: Diagnosis not present

## 2021-10-16 DIAGNOSIS — N200 Calculus of kidney: Secondary | ICD-10-CM | POA: Insufficient documentation

## 2021-10-16 DIAGNOSIS — K449 Diaphragmatic hernia without obstruction or gangrene: Secondary | ICD-10-CM | POA: Diagnosis not present

## 2021-10-16 DIAGNOSIS — J449 Chronic obstructive pulmonary disease, unspecified: Secondary | ICD-10-CM | POA: Diagnosis not present

## 2021-10-16 DIAGNOSIS — R051 Acute cough: Secondary | ICD-10-CM | POA: Diagnosis not present

## 2021-10-16 DIAGNOSIS — K4020 Bilateral inguinal hernia, without obstruction or gangrene, not specified as recurrent: Secondary | ICD-10-CM | POA: Diagnosis not present

## 2021-10-16 MED ORDER — AZITHROMYCIN 250 MG PO TABS: 250.0000 mg | ORAL_TABLET | Freq: Every day | ORAL | 0 refills | Status: DC

## 2021-10-16 NOTE — Discharge Instructions (Signed)
-  Continue Coricidin and Chloraseptic spray.  Consider using Flonase and nasal saline.  I have sent an antibiotic to the pharmacy.  If you develop a fever, chest pain or increased shortness of breath you should be seen again and reevaluated.

## 2021-10-16 NOTE — ED Triage Notes (Signed)
Pt presents today with c/o of nasal congestion, sore throat and cough x 4 days (Sun). Denies fever. He has been treating with OTC Coricidin. He does not want to be tested for Flu or Covid today.

## 2021-10-16 NOTE — ED Provider Notes (Signed)
[facility]    CSN: [id number] Arrival date & time: 10/16/21  1101      History   Chief Complaint Chief Complaint  Patient presents with   Nasal Congestion   Sore Throat   Cough    HPI Steven Wolfe is a 85 y.o. male with history of COPD, chronic sinusitis, diabetes, CAD, hypertension, prostate cancer, and hyperlipidemia.  Patient presents today for approximately 5-day history of nasal congestion with yellowish drainage, sinus pressure, productive cough, fatigue and sore throat.  He denies any fevers.  Denies any chest pain, wheezing or shortness of breath.  No changes from baseline.  Patient has been taking Coricidin HBP and Chloraseptic spray over-the-counter.  He says it is helped symptoms some but he believes he may need an antibiotic now.  Patient reports he has seen ENT in the past and when he is ill with symptoms like he has now he generally gets antibiotic.  He has not been exposed to influenza or COVID-19 and does not desire testing.  No other complaints.  HPI  Past Medical History:  Diagnosis Date   Arthritis    Bulging lumbar disc    Cancer ([place]) 02/24/2017   prostate   Chronic sinusitis    COPD (chronic obstructive pulmonary disease) (HCC)    Coronary artery disease    Degeneration, intervertebral disc, lumbosacral    Diabetes mellitus without complication (HCC)    GERD (gastroesophageal reflux disease)    Hemorrhoids    History of kidney stones    History of kidney stones    Hyperlipemia    Hypertension    Lumbar radiculitis    Lumbar stenosis with neurogenic claudication    Prostate cancer [facility])     Patient Active Problem List   Diagnosis Date Noted   Lumbar stenosis with neurogenic claudication 03/04/2017   Controlled type 2 diabetes mellitus with stage 3 chronic kidney disease, without long-term current use of insulin ([place]) 05/28/2016   Internal hemorrhoids 03/26/2016   Incomplete emptying of bladder 03/18/2016   Gastroesophageal reflux  disease without esophagitis 11/28/2015   Benign essential hypertension 03/27/2015   Mixed hyperlipidemia 03/27/2015   COPD (chronic obstructive pulmonary disease) ([place]) 11/01/2014   CAD (coronary artery disease) 07/24/2014   Degeneration of intervertebral disc of lumbosacral region 07/09/2014   Bulging lumbar disc 06/25/2014   Low back pain 06/25/2014   Bladder outlet obstruction 08/04/2013   Disorder of male genital organs 12/12/2012   Kidney stone 12/12/2012   Malignant neoplasm of prostate ([place]) 12/12/2012   Sciatica 12/12/2012   Spermatocele 12/12/2012    Past Surgical History:  Procedure Laterality Date   CARDIAC CATHETERIZATION     CHOLECYSTECTOMY  02/24/2017   20 years ago   COLONOSCOPY     COLONOSCOPY WITH PROPOFOL N/A 10/12/2018   Procedure: COLONOSCOPY WITH PROPOFOL;  Surgeon: Manya Silvas, MD;  Location: [facility] ENDOSCOPY;  Service: Endoscopy;  Laterality: N/A;   FEMUR FRACTURE SURGERY  50 years ago   FUNCTIONAL ENDOSCOPIC SINUS SURGERY     LEG SURGERY Left    ORIF    PROSTATE CRYOABLATION  2007       Home Medications    Prior to Admission medications   Medication Sig Start Date End Date Taking? Authorizing Provider  azithromycin (ZITHROMAX) 250 MG tablet Take 1 tablet (250 mg total) by mouth daily. Take first 2 tablets together, then 1 every day until finished. 10/16/21  Yes Laurene Footman B, PA-C  amLODipine (NORVASC) 5 MG tablet Take  5 mg by mouth 2 (two) times daily. 12/02/16   [provider]  cyclobenzaprine (FLEXERIL) 10 MG tablet Take 1 tablet (10 mg total) by mouth 3 (three) times daily as needed for muscle spasms. 03/06/17   Newman Pies, MD  docusate sodium (COLACE) 100 MG capsule Take 1 capsule (100 mg total) by mouth 2 (two) times daily. 03/06/17   Newman Pies, MD  gabapentin (NEURONTIN) 100 MG capsule  08/23/18   [provider]  gemfibrozil (LOPID) 600 MG tablet Take 600 mg by mouth 2 (two) times daily.    [provider]  glimepiride (AMARYL) 1 MG tablet Take 0.5 mg by mouth daily as needed. For blood sugar greater than 140.    [provider]  Lancets (FREESTYLE) lancets 1 each by Other route once daily Dx E11.9 04/21/18   [provider]  Multiple Vitamin (MULTIVITAMIN WITH MINERALS) TABS tablet Take 1 tablet by mouth daily. Multivitamins for Senior 50+    [provider]  Multiple Vitamins-Minerals (PRESERVISION AREDS 2 PO) Take 1 tablet by mouth daily.    [provider]  omeprazole (PRILOSEC) 20 MG capsule Take 20 mg by mouth daily. 12/24/16   [provider]  ramipril (ALTACE) 10 MG capsule Take 10 mg by mouth 2 (two) times daily. 01/11/17   [provider]  tetrahydrozoline 0.05 % ophthalmic solution Place 1 drop into both eyes 2 (two) times daily as needed (for scratchy eyes).    [provider]  tetrahydrozoline-zinc (VISINE-AC) 0.05-0.25 % ophthalmic solution 2 drops 3 (three) times daily as needed.    [provider]  traMADol (ULTRAM) 50 MG tablet Take 50 mg by mouth 5 (five) times daily as needed. For back pain. 01/11/17   [provider]    Family History Family History  Problem Relation Age of Onset   Cancer Father    Cancer Mother     Social History Social History   Tobacco Use   Smoking status: Former    Types: Cigarettes    Quit date: 02/24/2002    Years since quitting: 19.6   Smokeless tobacco: Current    Types: Chew  Vaping Use   Vaping Use: Never used  Substance Use Topics   Alcohol use: Yes    Comment: occasionally   Drug use: No     Allergies   Bee venom, Lovastatin, Chlorthalidone, and Rosuvastatin   Review of Systems Review of Systems  Constitutional:  Positive for fatigue (mild). Negative for fever.  HENT:  Positive for congestion, rhinorrhea, sinus pressure and sore throat. Negative for ear pain.   Respiratory:  Positive for cough. Negative for shortness of breath and  wheezing.   Cardiovascular:  Negative for chest pain.  Gastrointestinal:  Negative for abdominal pain, diarrhea, nausea and vomiting.  Musculoskeletal:  Negative for myalgias.  Neurological:  Negative for weakness, light-headedness and headaches.  Hematological:  Negative for adenopathy.    Physical Exam Triage Vital Signs ED Triage Vitals  Enc Vitals Group     BP      Pulse      Resp      Temp      Temp src      SpO2      Weight      Height      Head Circumference      Peak Flow      Pain Score      Pain Loc      Pain Edu?  Excl. in [place]?    No data found.  Updated Vital Signs BP (!) 142/78 (BP Location: Left Arm)   Pulse 74   Temp 98.4 F (36.9 C) (Oral)   Resp 18   SpO2 98%     Physical Exam Vitals and nursing note reviewed.  Constitutional:      General: He is not in acute distress.    Appearance: Normal appearance. He is well-developed. He is not ill-appearing.  HENT:     Head: Normocephalic and atraumatic.     Nose: Congestion present.     Mouth/Throat:     Mouth: Mucous membranes are moist.     Pharynx: Oropharynx is clear. Posterior oropharyngeal erythema (with thick yellow PND) present.  Eyes:     General: No scleral icterus.    Conjunctiva/sclera: Conjunctivae normal.  Cardiovascular:     Rate and Rhythm: Normal rate and regular rhythm.     Heart sounds: Normal heart sounds.  Pulmonary:     Effort: Pulmonary effort is normal. No respiratory distress.     Breath sounds: Normal breath sounds.  Musculoskeletal:     Cervical back: Neck supple.  Skin:    General: Skin is warm and dry.  Neurological:     General: No focal deficit present.     Mental Status: He is alert. Mental status is at baseline.     Motor: No weakness.     Coordination: Coordination normal.     Gait: Gait normal.  Psychiatric:        Mood and Affect: Mood normal.        Behavior: Behavior normal.        Thought Content: Thought content normal.     UC Treatments /  Results  Labs (all labs ordered are listed, but only abnormal results are displayed) Labs Reviewed - No data to display  EKG   Radiology No results found.  Procedures Procedures (including critical care time)  Medications Ordered in UC Medications - No data to display  Initial Impression / Assessment and Plan / UC Course  I have reviewed the triage vital signs and the nursing notes.  Pertinent labs & imaging results that were available during my care of the patient were reviewed by me and considered in my medical decision making (see chart for details).  85 year old male with history of COPD and chronic sinusitis presenting for 5-day history of discolored nasal drainage, sinus pressure and productive cough.  Vitals are stable.  He is afebrile and overall well-appearing.  Exam significant for nasal congestion, posterior potential erythema with thick yellow postnasal drainage.  Chest is clear to auscultation heart regular rate and rhythm.  Patient declines COVID and flu testing.  Treating him at this time for sinusitis/mild COPD exacerbation.  I have sent in azithromycin.  He says this is worked well for him in the past.  Advised to continue the Coricidin HBP and Chloraseptic spray and start a nasal spray.  Reviewed return and ED precautions.   Final Clinical Impressions(s) / UC Diagnoses   Final diagnoses:  Acute sinusitis, recurrence not specified, unspecified location  Acute cough  Chronic obstructive pulmonary disease, unspecified COPD type ([place])     Discharge Instructions      -Continue Coricidin and Chloraseptic spray.  Consider using Flonase and nasal saline.  I have sent an antibiotic to the pharmacy.  If you develop a fever, chest pain or increased shortness of breath you should be seen again and reevaluated.  ED Prescriptions     Medication Sig Dispense Auth. Provider   azithromycin (ZITHROMAX) 250 MG tablet Take 1 tablet (250 mg total) by mouth daily. Take  first 2 tablets together, then 1 every day until finished. 6 tablet Gretta Cool      PDMP not reviewed this encounter.   Danton Clap, PA-C 10/16/21 1327

## 2021-10-20 ENCOUNTER — Telehealth: Payer: Self-pay | Admitting: *Deleted

## 2021-10-20 NOTE — Telephone Encounter (Signed)
Notified patient as instructed, patient pleased °

## 2021-10-20 NOTE — Telephone Encounter (Signed)
-----   Message from Abbie Sons, MD sent at 10/18/2021 10:51 PM EDT ----- No findings on CT showed a cause of his right scrotal pain.  He has a nonobstructing stone in the left kidney which can be monitored

## 2021-10-29 DIAGNOSIS — M48062 Spinal stenosis, lumbar region with neurogenic claudication: Secondary | ICD-10-CM | POA: Diagnosis not present

## 2021-10-29 DIAGNOSIS — M5416 Radiculopathy, lumbar region: Secondary | ICD-10-CM | POA: Diagnosis not present

## 2021-12-23 IMAGING — US US SCROTUM W/ DOPPLER COMPLETE
1 series · 14 of 25 positions shown · non-contrast
Comparison: None.

CLINICAL DATA: Right scrotal pain

EXAM:
SCROTAL ULTRASOUND
DOPPLER ULTRASOUND OF THE TESTICLES
TECHNIQUE: Complete ultrasound examination of the testicles, epididymis, and
other scrotal structures was performed. Color and spectral Doppler
ultrasound were also utilized to evaluate blood flow to the
testicles.

[Series 1: us scrotum w/ doppler complete · 0.07mm/px · 14 of 32 slices shown]
[im 1/32]
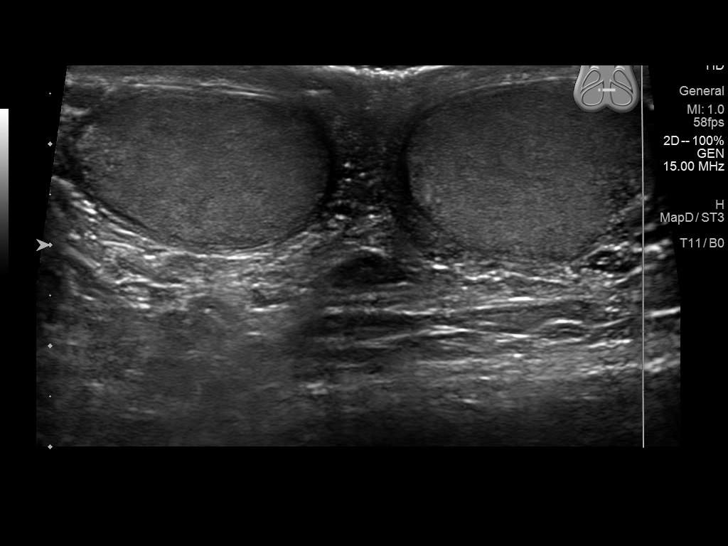
[im 3/32]
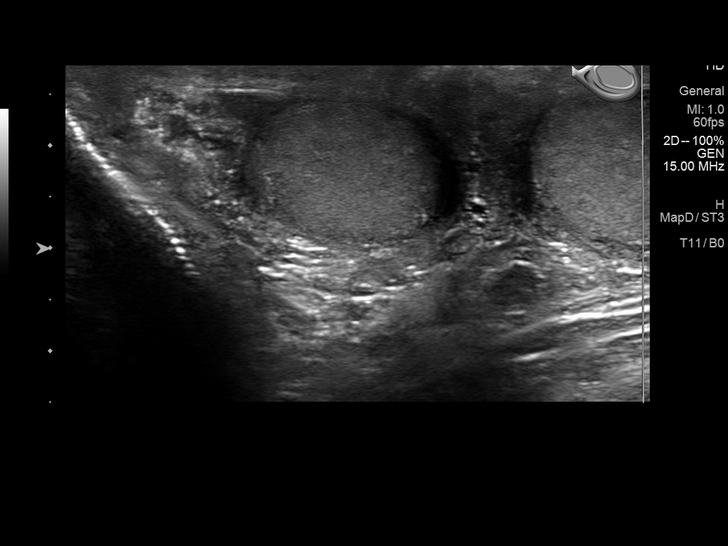
[im 6/32]
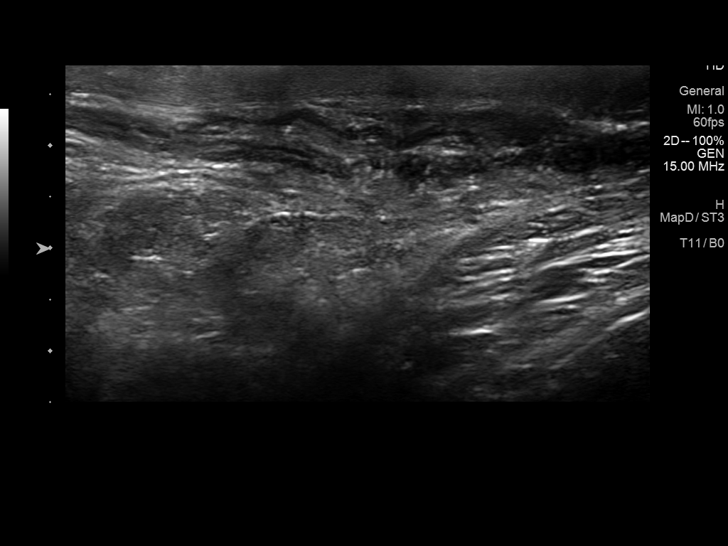
[im 8/32]
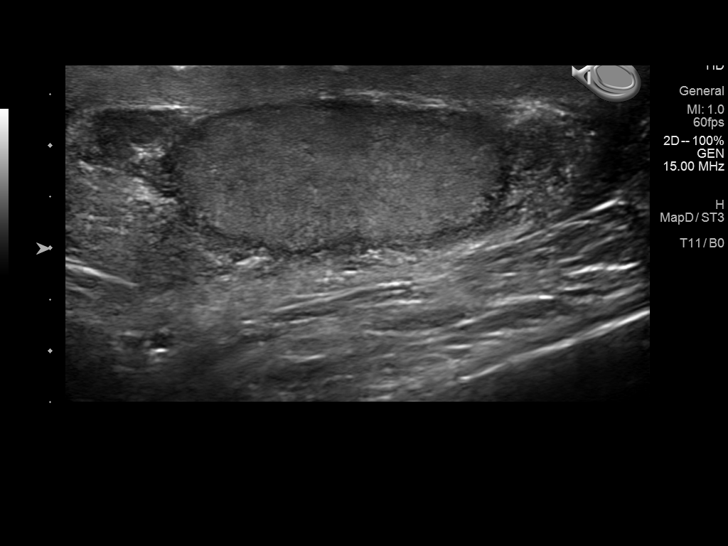
[im 11/32]
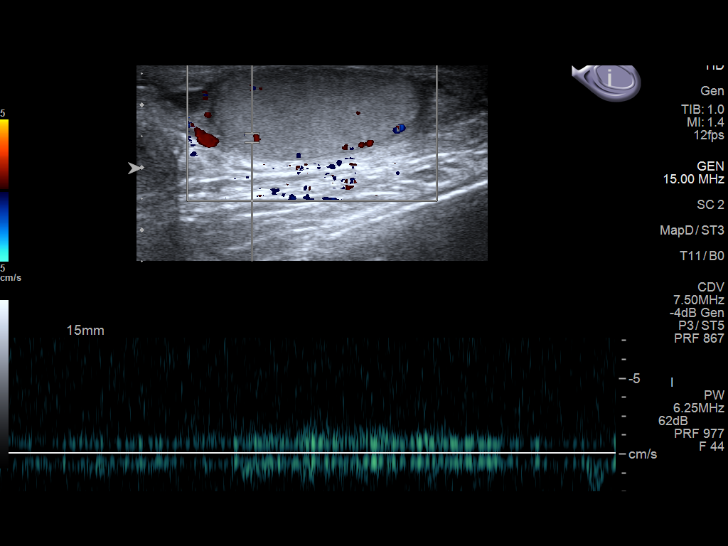
[im 12/32]
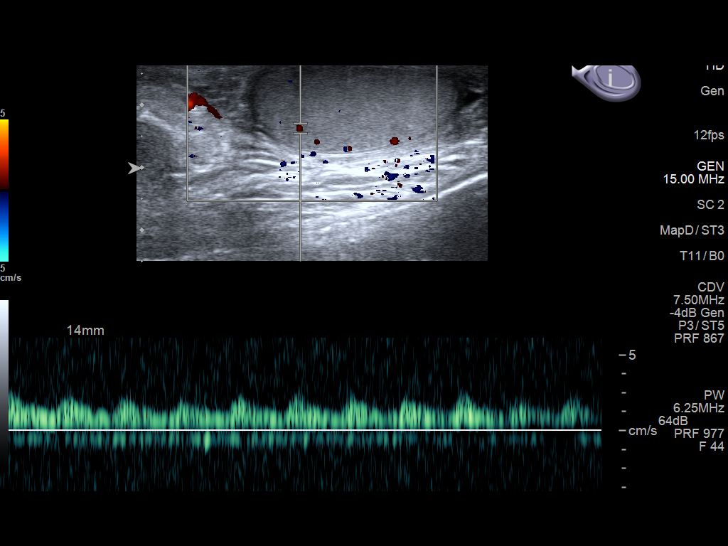
[im 15/32]
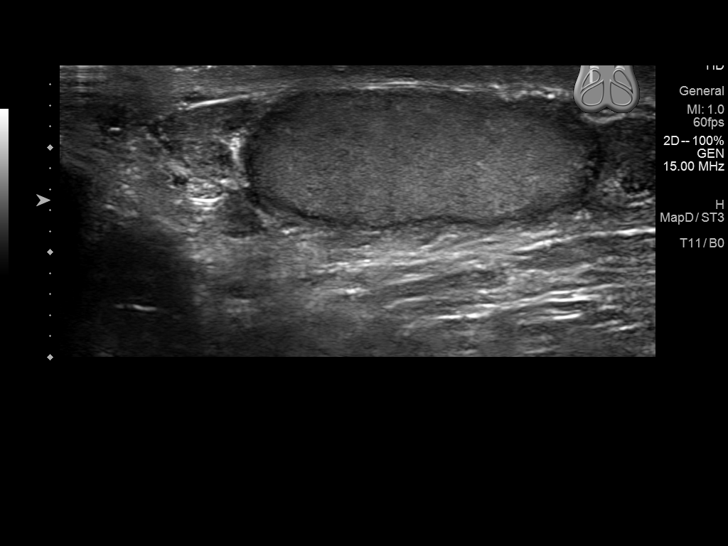
[im 17/32]
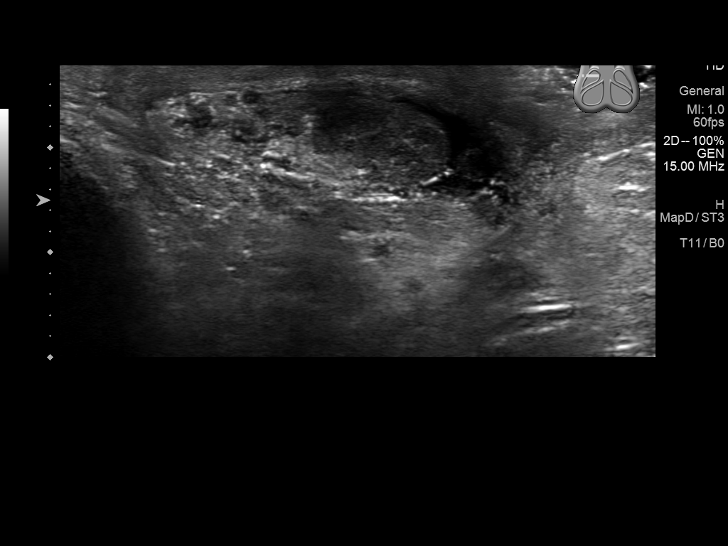
[im 20/32]
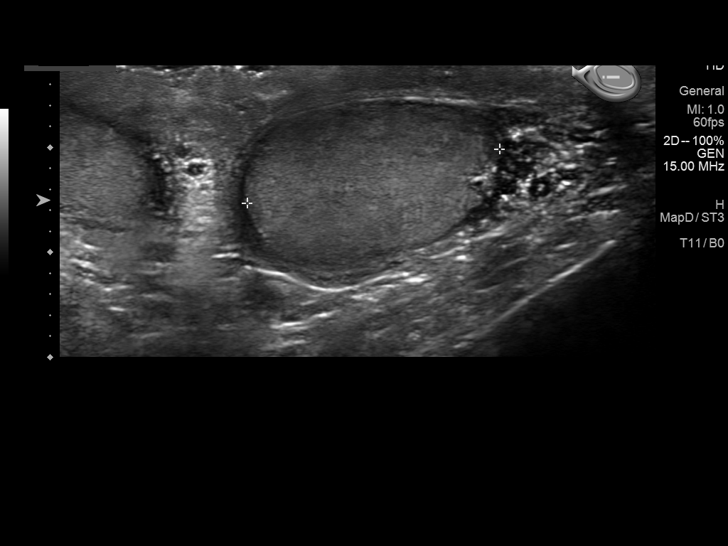
[im 21/32]
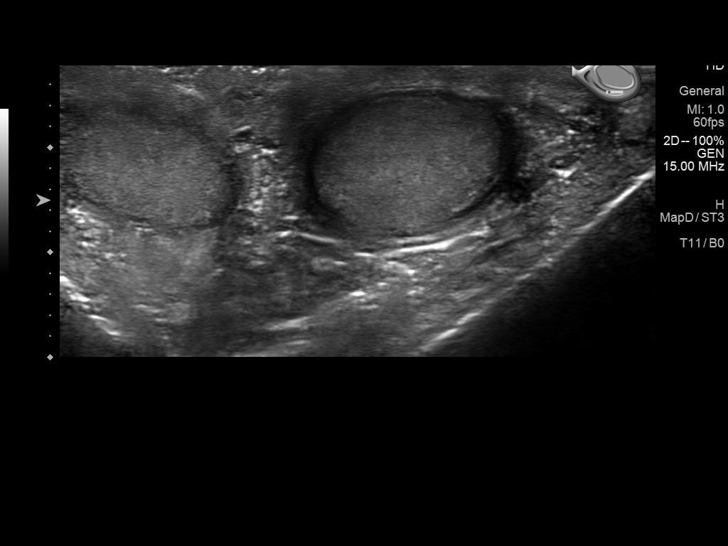
[im 24/32]
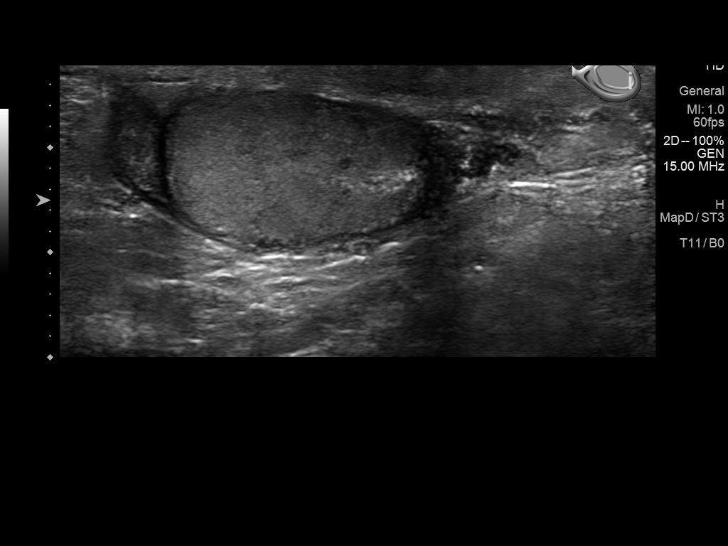
[im 26/32]
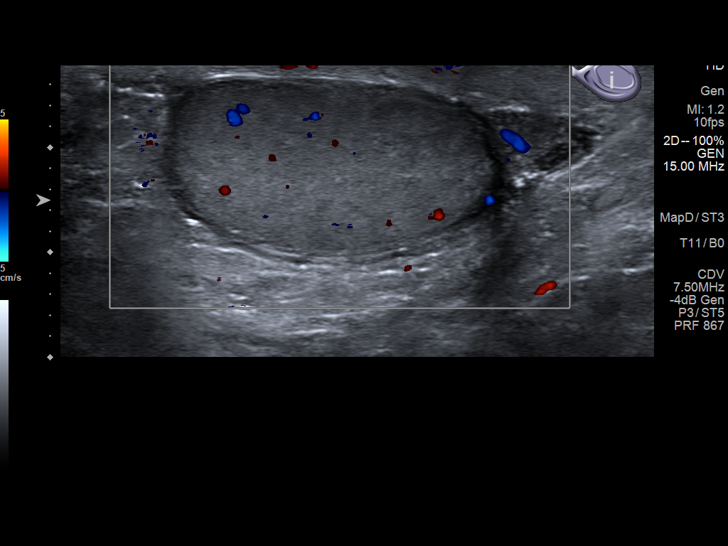
[im 29/32]
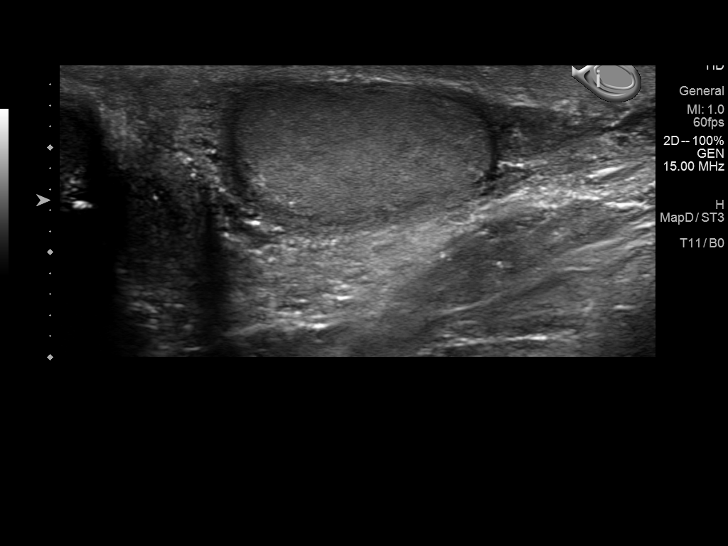
[im 32/32]
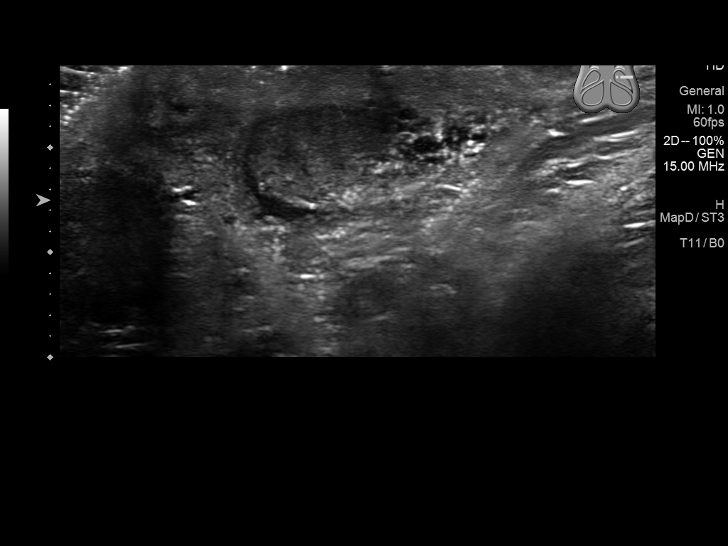

[14 of 25 positions shown; findings below may reference images not displayed]

FINDINGS: Right testicle

Measurements: 3.8 x 1.6 x 2.4 cm. No mass or microlithiasis
visualized.

Left testicle

Measurements: 3.4 x 1.6 x 2.5 cm. No mass or microlithiasis
visualized.

Right epididymis: Small, 5 mm right epididymal cyst. Otherwise
normal.

Left epididymis:  Normal in size and appearance.

Hydrocele:  None visualized.

Varicocele:  None visualized.

Pulsed Doppler interrogation of both testes demonstrates normal low
resistance arterial and venous waveforms bilaterally.
IMPRESSION: Normal scrotal ultrasound.

## 2021-12-31 IMAGING — CT CT RENAL STONE PROTOCOL
1 of 2 series · 15 of 32 positions shown, 19 images · non-contrast
Comparison: 01/02/2010

CLINICAL DATA: Nephrolithiasis. Right-sided back pain for several
weeks.

EXAM:
CT ABDOMEN AND PELVIS WITHOUT CONTRAST
TECHNIQUE: Multidetector CT imaging of the abdomen and pelvis was performed
following the standard protocol without IV contrast.

[Series 2: axial st · axial · 0.71mm/px · z∈[-944,-474]mm · 15 of 104 slices shown, 19 images]
[im 5/104  soft-tissue]
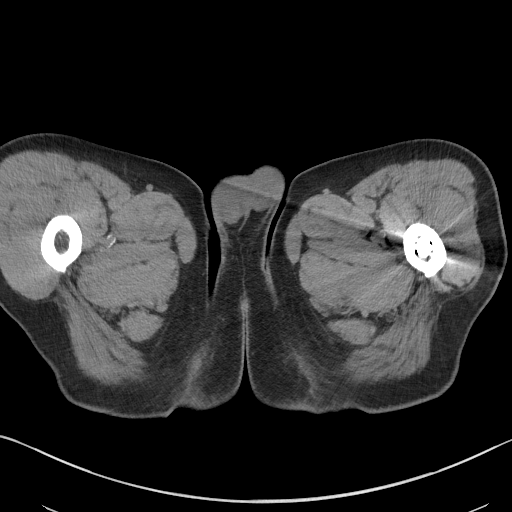
[im 5/104  bone]
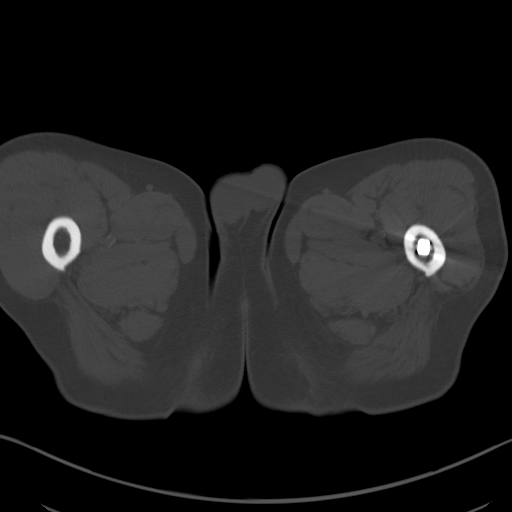
[im 13/104  soft-tissue]
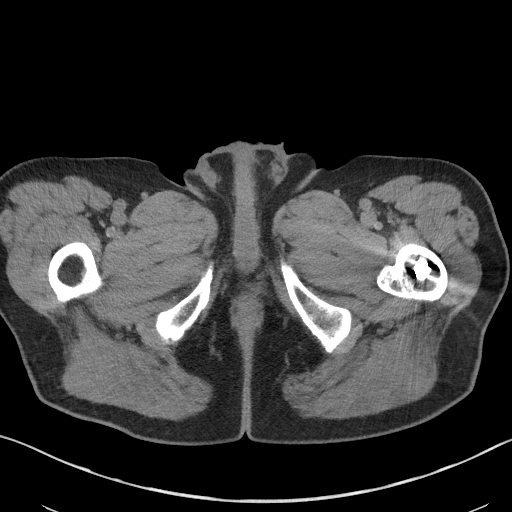
[im 22/104  soft-tissue]
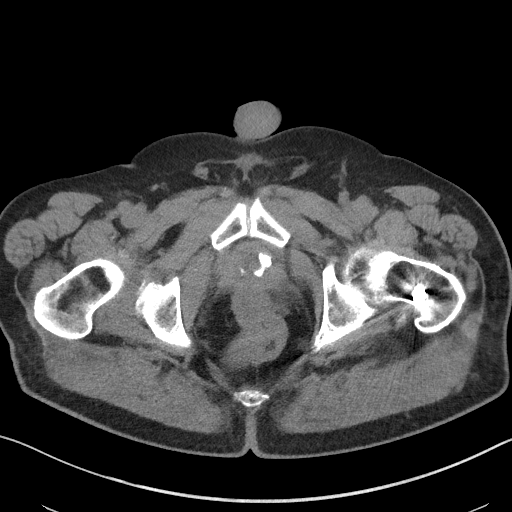
[im 31/104  soft-tissue]
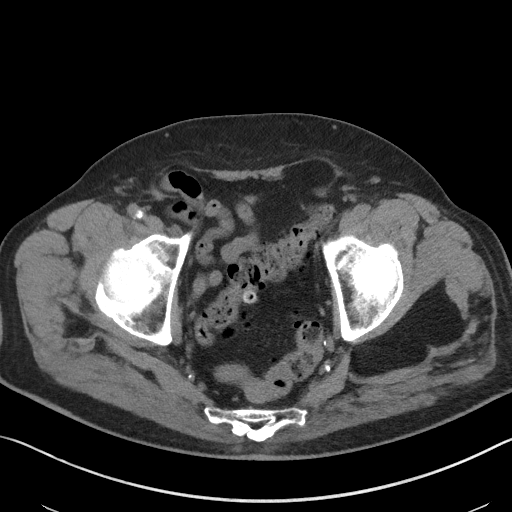
[im 35/104  soft-tissue]
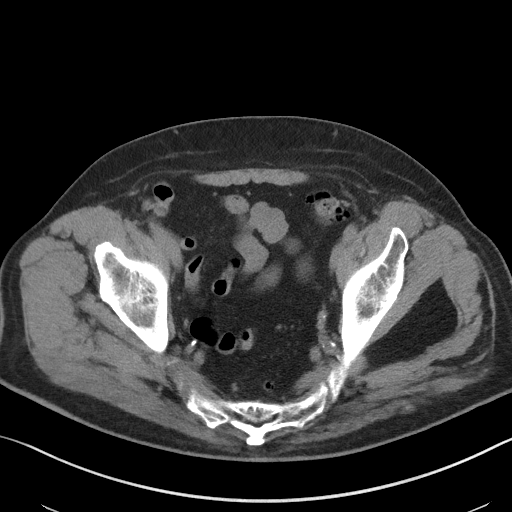
[im 43/104  soft-tissue]
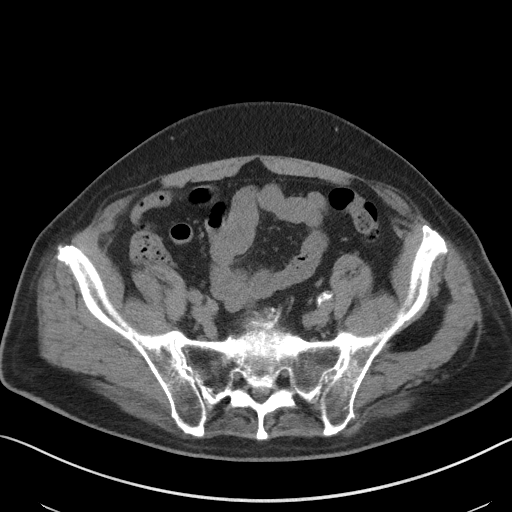
[im 52/104  soft-tissue]
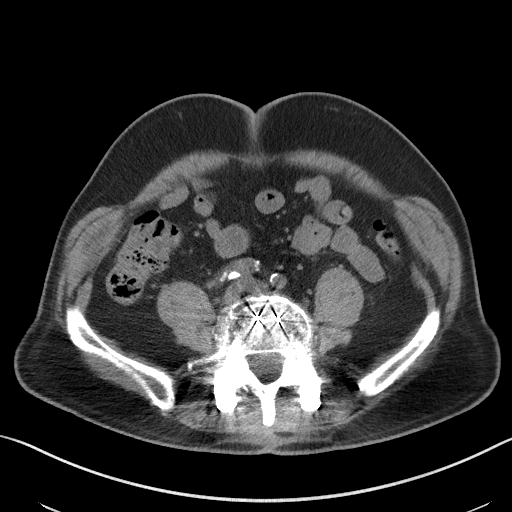
[im 61/104  soft-tissue]
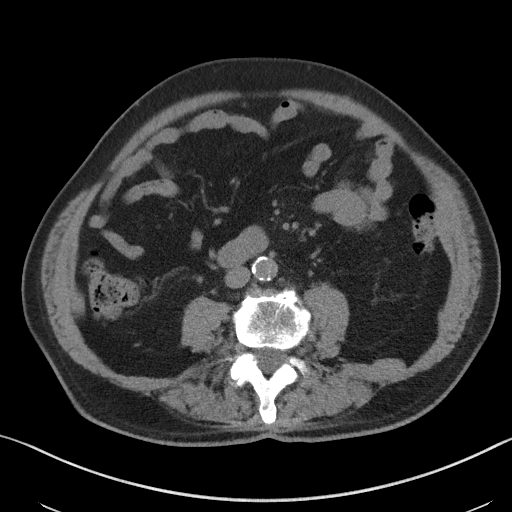
[im 69/104  soft-tissue]
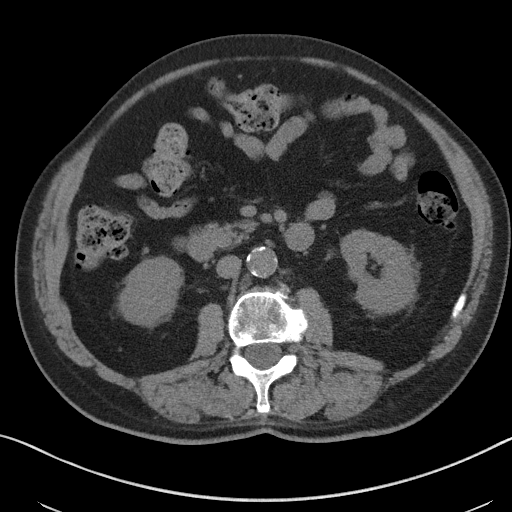
[im 69/104  bone]
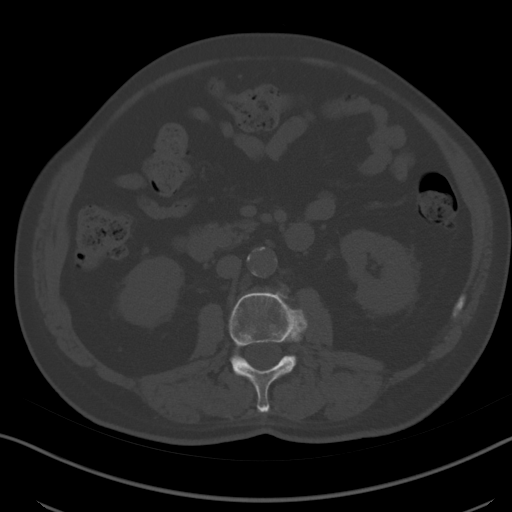
[im 73/104  soft-tissue]
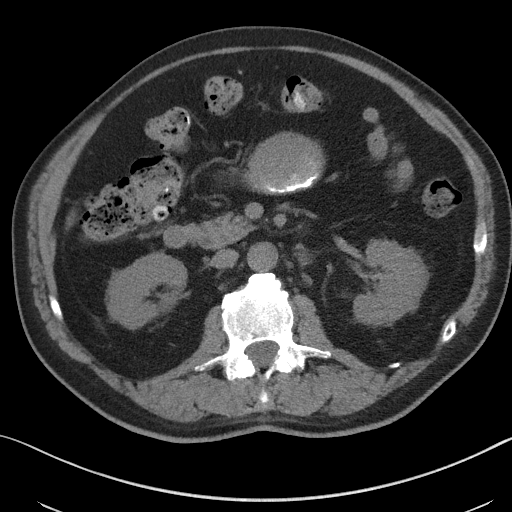
[im 82/104  soft-tissue]
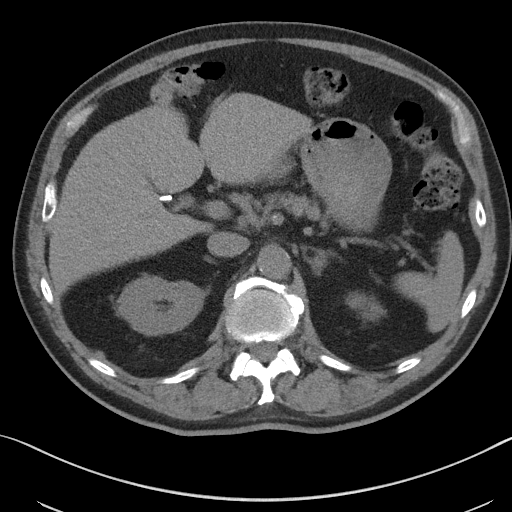
[im 86/104  lung]
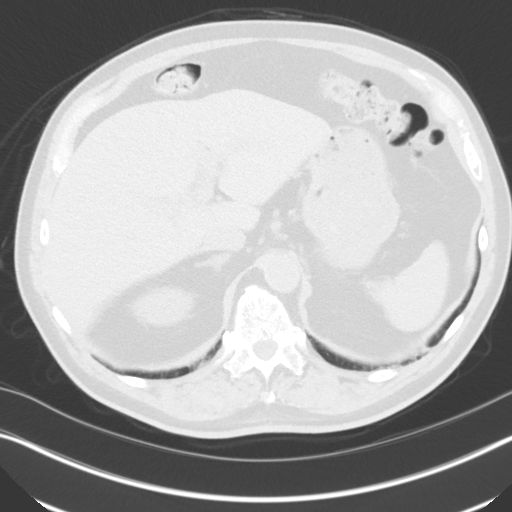
[im 91/104  soft-tissue]
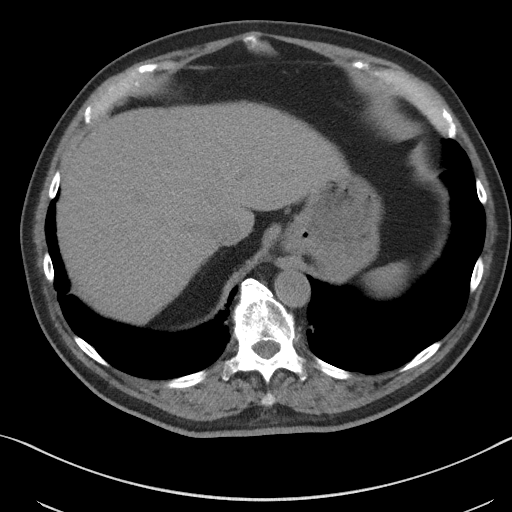
[im 91/104  lung]
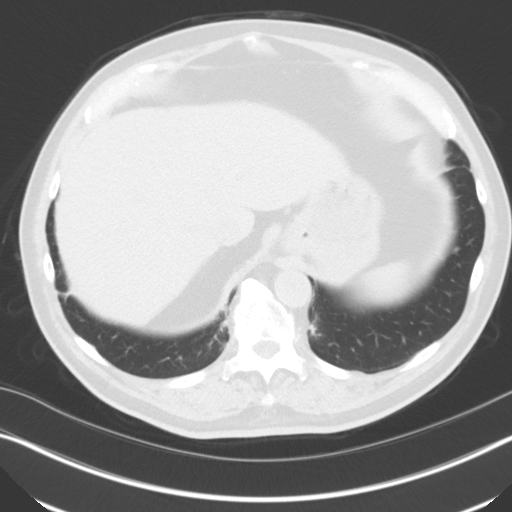
[im 95/104  lung]
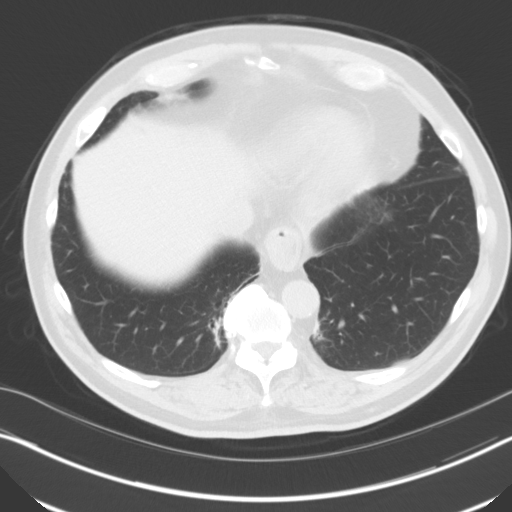
[im 99/104  soft-tissue]
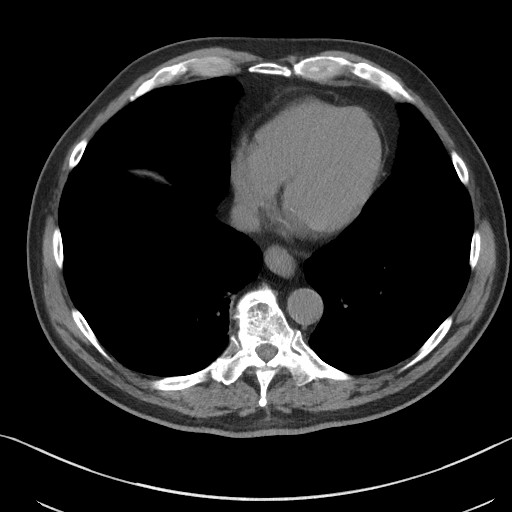
[im 99/104  lung]
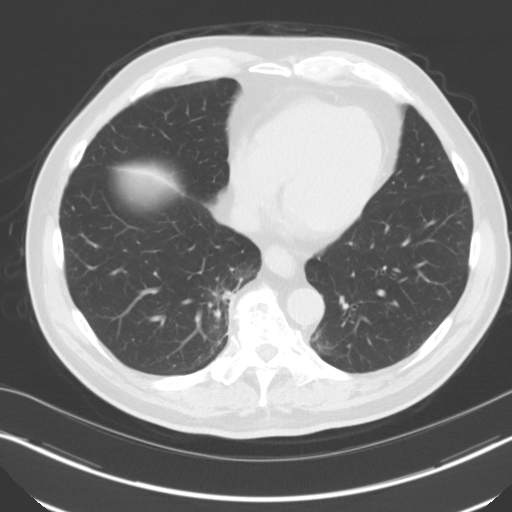

[15 of 32 positions shown; findings below may reference images not displayed]

FINDINGS: Lower chest: No acute findings.

Hepatobiliary: No mass visualized on this unenhanced exam. Prior
cholecystectomy. No evidence of biliary obstruction.

Pancreas: No mass or inflammatory process visualized on this
unenhanced exam.

Spleen:  Within normal limits in size.

Adrenals/Urinary tract: A calculus is seen in the upper pole
collecting system of the left kidney which measures 1.9 x 1.0 cm. No
evidence of ureteral calculi or hydronephrosis. Unremarkable
unopacified urinary bladder.

Stomach/Bowel: Small hiatal hernia is noted. No evidence of
obstruction, inflammatory process, or abnormal fluid collections.
Diffuse colonic diverticulosis is noted, however there is no
evidence of diverticulitis.

Vascular/Lymphatic: No pathologically enlarged lymph nodes
identified. No evidence of abdominal aortic aneurysm. Aortic
atherosclerotic calcification noted.

Reproductive:  No mass or other significant abnormality.

Other: Small right inguinal hernia is seen containing a single small
bowel loop. Small left inguinal hernia is seen containing only fat.

Musculoskeletal: No suspicious bone lesions identified. Lumbar spine
fusion hardware again seen as well as a internal fixation rod in the
left femur. Left gluteus intramuscular lipoma again noted.
IMPRESSION: 1.9 cm left renal calculus. No evidence of ureteral calculi,
hydronephrosis, or other acute findings.

Colonic diverticulosis, without radiographic evidence of
diverticulitis.

Small hiatal hernia, and small bilateral inguinal hernias.

Aortic Atherosclerosis ([id number]-[id number].C).

## 2022-01-21 DIAGNOSIS — E1122 Type 2 diabetes mellitus with diabetic chronic kidney disease: Secondary | ICD-10-CM | POA: Diagnosis not present

## 2022-01-21 DIAGNOSIS — N183 Chronic kidney disease, stage 3 unspecified: Secondary | ICD-10-CM | POA: Diagnosis not present

## 2022-02-03 DIAGNOSIS — K219 Gastro-esophageal reflux disease without esophagitis: Secondary | ICD-10-CM | POA: Diagnosis not present

## 2022-02-03 DIAGNOSIS — I2510 Atherosclerotic heart disease of native coronary artery without angina pectoris: Secondary | ICD-10-CM | POA: Diagnosis not present

## 2022-02-03 DIAGNOSIS — E782 Mixed hyperlipidemia: Secondary | ICD-10-CM | POA: Diagnosis not present

## 2022-02-03 DIAGNOSIS — E1122 Type 2 diabetes mellitus with diabetic chronic kidney disease: Secondary | ICD-10-CM | POA: Diagnosis not present

## 2022-02-03 DIAGNOSIS — R5383 Other fatigue: Secondary | ICD-10-CM | POA: Diagnosis not present

## 2022-02-03 DIAGNOSIS — J449 Chronic obstructive pulmonary disease, unspecified: Secondary | ICD-10-CM | POA: Diagnosis not present

## 2022-02-03 DIAGNOSIS — I10 Essential (primary) hypertension: Secondary | ICD-10-CM | POA: Diagnosis not present

## 2022-02-03 DIAGNOSIS — N183 Chronic kidney disease, stage 3 unspecified: Secondary | ICD-10-CM | POA: Diagnosis not present

## 2022-02-03 DIAGNOSIS — C61 Malignant neoplasm of prostate: Secondary | ICD-10-CM | POA: Diagnosis not present

## 2022-02-03 DIAGNOSIS — Z0000 Encounter for general adult medical examination without abnormal findings: Secondary | ICD-10-CM | POA: Diagnosis not present

## 2022-03-30 DIAGNOSIS — M5136 Other intervertebral disc degeneration, lumbar region: Secondary | ICD-10-CM | POA: Diagnosis not present

## 2022-03-30 DIAGNOSIS — M5416 Radiculopathy, lumbar region: Secondary | ICD-10-CM | POA: Diagnosis not present

## 2022-03-30 DIAGNOSIS — Z79899 Other long term (current) drug therapy: Secondary | ICD-10-CM | POA: Diagnosis not present

## 2022-03-30 DIAGNOSIS — M48062 Spinal stenosis, lumbar region with neurogenic claudication: Secondary | ICD-10-CM | POA: Diagnosis not present

## 2022-04-01 DIAGNOSIS — M5416 Radiculopathy, lumbar region: Secondary | ICD-10-CM | POA: Diagnosis not present

## 2022-04-01 DIAGNOSIS — M48062 Spinal stenosis, lumbar region with neurogenic claudication: Secondary | ICD-10-CM | POA: Diagnosis not present

## 2022-05-14 DIAGNOSIS — H353132 Nonexudative age-related macular degeneration, bilateral, intermediate dry stage: Secondary | ICD-10-CM | POA: Diagnosis not present

## 2022-05-14 DIAGNOSIS — E119 Type 2 diabetes mellitus without complications: Secondary | ICD-10-CM | POA: Diagnosis not present

## 2022-05-14 DIAGNOSIS — H2513 Age-related nuclear cataract, bilateral: Secondary | ICD-10-CM | POA: Diagnosis not present

## 2022-07-14 ENCOUNTER — Ambulatory Visit: Payer: PPO | Admitting: Dermatology

## 2022-07-14 DIAGNOSIS — D229 Melanocytic nevi, unspecified: Secondary | ICD-10-CM | POA: Diagnosis not present

## 2022-07-14 DIAGNOSIS — L820 Inflamed seborrheic keratosis: Secondary | ICD-10-CM | POA: Diagnosis not present

## 2022-07-14 DIAGNOSIS — L814 Other melanin hyperpigmentation: Secondary | ICD-10-CM

## 2022-07-14 DIAGNOSIS — L578 Other skin changes due to chronic exposure to nonionizing radiation: Secondary | ICD-10-CM

## 2022-07-14 DIAGNOSIS — L821 Other seborrheic keratosis: Secondary | ICD-10-CM

## 2022-07-14 DIAGNOSIS — Z1283 Encounter for screening for malignant neoplasm of skin: Secondary | ICD-10-CM

## 2022-07-14 DIAGNOSIS — D1800 Hemangioma unspecified site: Secondary | ICD-10-CM | POA: Diagnosis not present

## 2022-07-14 NOTE — Progress Notes (Signed)
   Follow-Up Visit   Subjective  Steven Wolfe is a 86 y.o. male who presents for the following: Annual Exam (Here for skin cancer screening. No personal history of skin cancer or dysplastic nevi. Areas of concern today).  Several spots are irritated and itchy.  The patient presents for Total-Body Skin Exam (TBSE) for skin cancer screening and mole check.  The patient has spots, moles and lesions to be evaluated, some may be new or changing and the patient has concerns that these could be cancer.   The following portions of the chart were reviewed this encounter and updated as appropriate:      Review of Systems: No other skin or systemic complaints except as noted in HPI or Assessment and Plan.   Objective  Well appearing patient in no apparent distress; mood and affect are within normal limits.  All skin waist up examined.  Left Shoulder - Posterior x1, left dorsum forearm x2, right dorsum wrist x1 (4) Erythematous keratotic or waxy stuck-on papule   Assessment & Plan   Lentigines - Scattered tan macules - Due to sun exposure - Benign-appearing, observe - Recommend daily broad spectrum sunscreen SPF 30+ to sun-exposed areas, reapply every 2 hours as needed. - Call for any changes  Seborrheic Keratoses - Stuck-on, waxy, tan-brown papules and/or plaques  - Benign-appearing - Discussed benign etiology and prognosis. - Observe - Call for any changes  Melanocytic Nevi - Tan-brown and/or pink-flesh-colored symmetric macules and papules - Benign appearing on exam today - Observation - Call clinic for new or changing moles - Recommend daily use of broad spectrum spf 30+ sunscreen to sun-exposed areas.   Hemangiomas - Red papules - Discussed benign nature - Observe - Call for any changes  Actinic Damage - Chronic condition, secondary to cumulative UV/sun exposure - diffuse scaly erythematous macules with underlying dyspigmentation - Recommend daily broad spectrum  sunscreen SPF 30+ to sun-exposed areas, reapply every 2 hours as needed.  - Staying in the shade or wearing long sleeves, sun glasses (UVA+UVB protection) and wide brim hats (4-inch brim around the entire circumference of the hat) are also recommended for sun protection.  - Call for new or changing lesions.  Skin cancer screening performed today.  Inflamed seborrheic keratosis (4) Left Shoulder - Posterior x1, left dorsum forearm x2, right dorsum wrist x1  Symptomatic, irritating, patient would like treated.  Destruction of lesion - Left Shoulder - Posterior x1, left dorsum forearm x2, right dorsum wrist x1  Destruction method: cryotherapy   Informed consent: discussed and consent obtained   Lesion destroyed using liquid nitrogen: Yes   Region frozen until ice ball extended beyond lesion: Yes   Outcome: patient tolerated procedure well with no complications   Post-procedure details: wound care instructions given   Additional details:  Prior to procedure, discussed risks of blister formation, small wound, skin dyspigmentation, or rare scar following cryotherapy. Recommend Vaseline ointment to treated areas while healing.    Return in about 1 year (around 07/15/2023) for UBSE.  I, Emelia Salisbury, CMA, am acting as scribe for Brendolyn Patty, MD.  Documentation: I have reviewed the above documentation for accuracy and completeness, and I agree with the above.  Brendolyn Patty MD

## 2022-07-14 NOTE — Patient Instructions (Addendum)
Cryotherapy Aftercare  Wash gently with soap and water everyday.   Apply Vaseline daily until healed.    Recommend daily broad spectrum sunscreen SPF 30+ to sun-exposed areas, reapply every 2 hours as needed. Call for new or changing lesions.  Staying in the shade or wearing long sleeves, sun glasses (UVA+UVB protection) and wide brim hats (4-inch brim around the entire circumference of the hat) are also recommended for sun protection.    Seborrheic Keratosis  What causes seborrheic keratoses? Seborrheic keratoses are harmless, common skin growths that first appear during adult life.  As time goes by, more growths appear.  Some people may develop a large number of them.  Seborrheic keratoses appear on both covered and uncovered body parts.  They are not caused by sunlight.  The tendency to develop seborrheic keratoses can be inherited.  They vary in color from skin-colored to gray, brown, or even black.  They can be either smooth or have a rough, warty surface.   Seborrheic keratoses are superficial and look as if they were stuck on the skin.  Under the microscope this type of keratosis looks like layers upon layers of skin.  That is why at times the top layer may seem to fall off, but the rest of the growth remains and re-grows.    Treatment Seborrheic keratoses do not need to be treated, but can easily be removed in the office.  Seborrheic keratoses often cause symptoms when they rub on clothing or jewelry.  Lesions can be in the way of shaving.  If they become inflamed, they can cause itching, soreness, or burning.  Removal of a seborrheic keratosis can be accomplished by freezing, burning, or surgery. If any spot bleeds, scabs, or grows rapidly, please return to have it checked, as these can be an indication of a skin cancer.    Melanoma ABCDEs  Melanoma is the most dangerous type of skin cancer, and is the leading cause of death from skin disease.  You are more likely to develop melanoma  if you: Have light-colored skin, light-colored eyes, or red or blond hair Spend a lot of time in the sun Tan regularly, either outdoors or in a tanning bed Have had blistering sunburns, especially during childhood Have a close family member who has had a melanoma Have atypical moles or large birthmarks  Early detection of melanoma is key since treatment is typically straightforward and cure rates are extremely high if we catch it early.   The first sign of melanoma is often a change in a mole or a new dark spot.  The ABCDE system is a way of remembering the signs of melanoma.  A for asymmetry:  The two halves do not match. B for border:  The edges of the growth are irregular. C for color:  A mixture of colors are present instead of an even brown color. D for diameter:  Melanomas are usually (but not always) greater than 6mm - the size of a pencil eraser. E for evolution:  The spot keeps changing in size, shape, and color.  Please check your skin once per month between visits. You can use a small mirror in front and a large mirror behind you to keep an eye on the back side or your body.   If you see any new or changing lesions before your next follow-up, please call to schedule a visit.  Please continue daily skin protection including broad spectrum sunscreen SPF 30+ to sun-exposed areas, reapplying every 2 hours   as needed when you're outdoors.   Staying in the shade or wearing long sleeves, sun glasses (UVA+UVB protection) and wide brim hats (4-inch brim around the entire circumference of the hat) are also recommended for sun protection.    Due to recent changes in healthcare laws, you may see results of your pathology and/or laboratory studies on MyChart before the doctors have had a chance to review them. We understand that in some cases there may be results that are confusing or concerning to you. Please understand that not all results are received at the same time and often the doctors  may need to interpret multiple results in order to provide you with the best plan of care or course of treatment. Therefore, we ask that you please give us 2 business days to thoroughly review all your results before contacting the office for clarification. Should we see a critical lab result, you will be contacted sooner.   If You Need Anything After Your Visit  If you have any questions or concerns for your doctor, please call our main line at [phone] and press option 4 to reach your doctor's medical assistant. If no one answers, please leave a voicemail as directed and we will return your call as soon as possible. Messages left after 4 pm will be answered the following business day.   You may also send us a message via MyChart. We typically respond to MyChart messages within 1-2 business days.  For prescription refills, please ask your pharmacy to contact our office. Our fax number is [phone].  If you have an urgent issue when the clinic is closed that cannot wait until the next business day, you can page your doctor at the number below.    Please note that while we do our best to be available for urgent issues outside of office hours, we are not available 24/7.   If you have an urgent issue and are unable to reach us, you may choose to seek medical care at your doctor's office, retail clinic, urgent care center, or emergency room.  If you have a medical emergency, please immediately call 911 or go to the emergency department.  Pager Numbers  - Dr. Kowalski: [phone]  - Dr. Moye: [phone]  - Dr. Stewart: [phone]  In the event of inclement weather, please call our main line at [phone] for an update on the status of any delays or closures.  Dermatology Medication Tips: Please keep the boxes that topical medications come in in order to help keep track of the instructions about where and how to use these. Pharmacies typically print the medication instructions  only on the boxes and not directly on the medication tubes.   If your medication is too expensive, please contact our office at [phone] option 4 or send us a message through MyChart.   We are unable to tell what your co-pay for medications will be in advance as this is different depending on your insurance coverage. However, we may be able to find a substitute medication at lower cost or fill out paperwork to get insurance to cover a needed medication.   If a prior authorization is required to get your medication covered by your insurance company, please allow us 1-2 business days to complete this process.  Drug prices often vary depending on where the prescription is filled and some pharmacies may offer cheaper prices.  The website www.goodrx.com contains coupons for medications through different pharmacies. The prices here do not account for what   the cost may be with help from insurance (it may be cheaper with your insurance), but the website can give you the price if you did not use any insurance.  - You can print the associated coupon and take it with your prescription to the pharmacy.  - You may also stop by our office during regular business hours and pick up a GoodRx coupon card.  - If you need your prescription sent electronically to a different pharmacy, notify our office through [facility] MyChart or by phone at [phone] option 4.     Si Usted Necesita Algo Despus de Su Visita  Tambin puede enviarnos un mensaje a travs de MyChart. Por lo general respondemos a los mensajes de MyChart en el transcurso de 1 a 2 das hbiles.  Para renovar recetas, por favor pida a su farmacia que se ponga en contacto con nuestra oficina. Nuestro nmero de fax es el [phone].  Si tiene un asunto urgente cuando la clnica est cerrada y que no puede esperar hasta el siguiente da hbil, puede llamar/localizar a su doctor(a) al nmero que aparece a continuacin.   Por favor, tenga en  cuenta que aunque hacemos todo lo posible para estar disponibles para asuntos urgentes fuera del horario de oficina, no estamos disponibles las 24 horas del da, los 7 das de la semana.   Si tiene un problema urgente y no puede comunicarse con nosotros, puede optar por buscar atencin mdica  en el consultorio de su doctor(a), en una clnica privada, en un centro de atencin urgente o en una sala de emergencias.  Si tiene una emergencia mdica, por favor llame inmediatamente al 911 o vaya a la sala de emergencias.  Nmeros de bper  - Dr. Kowalski: [phone]  - Dra. Moye: [phone]  - Dra. Stewart: [phone]  En caso de inclemencias del tiempo, por favor llame a nuestra lnea principal al [phone] para una actualizacin sobre el estado de cualquier retraso o cierre.  Consejos para la medicacin en dermatologa: Por favor, guarde las cajas en las que vienen los medicamentos de uso tpico para ayudarle a seguir las instrucciones sobre dnde y cmo usarlos. Las farmacias generalmente imprimen las instrucciones del medicamento slo en las cajas y no directamente en los tubos del medicamento.   Si su medicamento es muy caro, por favor, pngase en contacto con nuestra oficina llamando al [phone] y presione la opcin 4 o envenos un mensaje a travs de MyChart.   No podemos decirle cul ser su copago por los medicamentos por adelantado ya que esto es diferente dependiendo de la cobertura de su seguro. Sin embargo, es posible que podamos encontrar un medicamento sustituto a menor costo o llenar un formulario para que el seguro cubra el medicamento que se considera necesario.   Si se requiere una autorizacin previa para que su compaa de seguros cubra su medicamento, por favor permtanos de 1 a 2 das hbiles para completar este proceso.  Los precios de los medicamentos varan con frecuencia dependiendo del lugar de dnde se surte la receta y alguna farmacias pueden ofrecer  precios ms baratos.  El sitio web www.goodrx.com tiene cupones para medicamentos de diferentes farmacias. Los precios aqu no tienen en cuenta lo que podra costar con la ayuda del seguro (puede ser ms barato con su seguro), pero el sitio web puede darle el precio si no utiliz ningn seguro.  - Puede imprimir el cupn correspondiente y llevarlo con su receta a la farmacia.  - Tambin puede pasar   pasar por nuestra oficina durante el horario de atencin regular y [employer] fund[employer] una tarjeta de cupones de GoodRx.  - Si necesita que su receta se enve electrnicamente a una farmacia diferente, informe a nuestra oficina a travs de MyChart de  o por telfono llamando al [phone] y presione la opcin 4.

## 2022-07-27 DIAGNOSIS — E1122 Type 2 diabetes mellitus with diabetic chronic kidney disease: Secondary | ICD-10-CM | POA: Diagnosis not present

## 2022-07-27 DIAGNOSIS — N183 Chronic kidney disease, stage 3 unspecified: Secondary | ICD-10-CM | POA: Diagnosis not present

## 2022-08-03 DIAGNOSIS — E782 Mixed hyperlipidemia: Secondary | ICD-10-CM | POA: Diagnosis not present

## 2022-08-03 DIAGNOSIS — J449 Chronic obstructive pulmonary disease, unspecified: Secondary | ICD-10-CM | POA: Diagnosis not present

## 2022-08-03 DIAGNOSIS — E1122 Type 2 diabetes mellitus with diabetic chronic kidney disease: Secondary | ICD-10-CM | POA: Diagnosis not present

## 2022-08-03 DIAGNOSIS — Z0001 Encounter for general adult medical examination with abnormal findings: Secondary | ICD-10-CM | POA: Diagnosis not present

## 2022-08-03 DIAGNOSIS — N183 Chronic kidney disease, stage 3 unspecified: Secondary | ICD-10-CM | POA: Diagnosis not present

## 2022-08-03 DIAGNOSIS — I2510 Atherosclerotic heart disease of native coronary artery without angina pectoris: Secondary | ICD-10-CM | POA: Diagnosis not present

## 2022-08-03 DIAGNOSIS — K219 Gastro-esophageal reflux disease without esophagitis: Secondary | ICD-10-CM | POA: Diagnosis not present

## 2022-08-03 DIAGNOSIS — I10 Essential (primary) hypertension: Secondary | ICD-10-CM | POA: Diagnosis not present

## 2022-08-07 DIAGNOSIS — J019 Acute sinusitis, unspecified: Secondary | ICD-10-CM | POA: Diagnosis not present

## 2022-08-07 DIAGNOSIS — N183 Chronic kidney disease, stage 3 unspecified: Secondary | ICD-10-CM | POA: Diagnosis not present

## 2022-08-07 DIAGNOSIS — I10 Essential (primary) hypertension: Secondary | ICD-10-CM | POA: Diagnosis not present

## 2022-08-07 DIAGNOSIS — E1122 Type 2 diabetes mellitus with diabetic chronic kidney disease: Secondary | ICD-10-CM | POA: Diagnosis not present

## 2022-08-07 DIAGNOSIS — J029 Acute pharyngitis, unspecified: Secondary | ICD-10-CM | POA: Diagnosis not present

## 2022-08-21 DIAGNOSIS — N183 Chronic kidney disease, stage 3 unspecified: Secondary | ICD-10-CM | POA: Diagnosis not present

## 2022-08-21 DIAGNOSIS — I10 Essential (primary) hypertension: Secondary | ICD-10-CM | POA: Diagnosis not present

## 2022-08-21 DIAGNOSIS — E782 Mixed hyperlipidemia: Secondary | ICD-10-CM | POA: Diagnosis not present

## 2022-08-21 DIAGNOSIS — R9431 Abnormal electrocardiogram [ECG] [EKG]: Secondary | ICD-10-CM | POA: Diagnosis not present

## 2022-08-21 DIAGNOSIS — I2510 Atherosclerotic heart disease of native coronary artery without angina pectoris: Secondary | ICD-10-CM | POA: Diagnosis not present

## 2022-08-21 DIAGNOSIS — E1122 Type 2 diabetes mellitus with diabetic chronic kidney disease: Secondary | ICD-10-CM | POA: Diagnosis not present

## 2022-08-31 DIAGNOSIS — H16141 Punctate keratitis, right eye: Secondary | ICD-10-CM | POA: Diagnosis not present

## 2022-09-22 DIAGNOSIS — Z6825 Body mass index (BMI) 25.0-25.9, adult: Secondary | ICD-10-CM | POA: Diagnosis not present

## 2022-09-22 DIAGNOSIS — M4807 Spinal stenosis, lumbosacral region: Secondary | ICD-10-CM | POA: Diagnosis not present

## 2022-09-24 ENCOUNTER — Ambulatory Visit: Payer: PPO | Admitting: Urology

## 2022-09-24 ENCOUNTER — Encounter: Payer: Self-pay | Admitting: Urology

## 2022-09-24 VITALS — BP 134/67 | HR 80 | Ht 70.0 in | Wt 180.0 lb

## 2022-09-24 DIAGNOSIS — Z8546 Personal history of malignant neoplasm of prostate: Secondary | ICD-10-CM

## 2022-09-24 DIAGNOSIS — N5082 Scrotal pain: Secondary | ICD-10-CM

## 2022-09-24 DIAGNOSIS — N200 Calculus of kidney: Secondary | ICD-10-CM | POA: Diagnosis not present

## 2022-09-24 NOTE — Progress Notes (Signed)
09/24/2022 9:16 AM   Steven Wolfe Oct 25, 1936 [id number]  Referring provider: Baxter Hire, MD [place],  [place] [address]  Chief Complaint  Patient presents with   Prostate Cancer    Urologic history:   1.  Prostate cancer Prostate cryoablation November 2006; Gleason score and PSA at time of diagnosis not available on record review   2.  Nephrolithiasis CT 10/2021 with 19 mm nonobstructing left upper pole calculus-asymptomatic  3.  Chronic right scrotal content pain Most likely neuropathic  HPI: 86 y.o. male presents for annual follow-up.  Doing well since last visit No bothersome LUTS Denies dysuria, gross hematuria Denies flank, abdominal or pelvic pain Stable scrotal pain which is intermittent  PMH: Past Medical History:  Diagnosis Date   Arthritis    Bulging lumbar disc    Cancer ([place]) 02/24/2017   prostate   Chronic sinusitis    COPD (chronic obstructive pulmonary disease) (HCC)    Coronary artery disease    Degeneration, intervertebral disc, lumbosacral    Diabetes mellitus without complication (HCC)    GERD (gastroesophageal reflux disease)    Hemorrhoids    History of kidney stones    History of kidney stones    Hyperlipemia    Hypertension    Lumbar radiculitis    Lumbar stenosis with neurogenic claudication    Prostate cancer [facility])     Surgical History: Past Surgical History:  Procedure Laterality Date   CARDIAC CATHETERIZATION     CHOLECYSTECTOMY  02/24/2017   20 years ago   COLONOSCOPY     COLONOSCOPY WITH PROPOFOL N/A 10/12/2018   Procedure: COLONOSCOPY WITH PROPOFOL;  Surgeon: Manya Silvas, MD;  Location: [facility] ENDOSCOPY;  Service: Endoscopy;  Laterality: N/A;   FEMUR FRACTURE SURGERY  50 years ago   FUNCTIONAL ENDOSCOPIC SINUS SURGERY     LEG SURGERY Left    ORIF    PROSTATE CRYOABLATION  2007    Home Medications:  Allergies as of 09/24/2022       Reactions   Bee Venom Shortness Of Breath    Difficulty breathing   Lovastatin    Other reaction(s): Abdominal Pain   Chlorthalidone Other (See Comments)   Unsure of reaction   Rosuvastatin    Other reaction(s): Abdominal Pain Patient stated it also made him really nervous         Medication List        Accurate as of September 24, 2022  9:16 AM. If you have any questions, ask your nurse or doctor.          amLODipine 5 MG tablet Commonly known as: NORVASC Take 5 mg by mouth 2 (two) times daily.   azithromycin 250 MG tablet Commonly known as: ZITHROMAX Take 1 tablet (250 mg total) by mouth daily. Take first 2 tablets together, then 1 every day until finished.   cyclobenzaprine 10 MG tablet Commonly known as: FLEXERIL Take 1 tablet (10 mg total) by mouth 3 (three) times daily as needed for muscle spasms.   docusate sodium 100 MG capsule Commonly known as: COLACE Take 1 capsule (100 mg total) by mouth 2 (two) times daily.   freestyle lancets 1 each by Other route once daily Dx E11.9   gabapentin 100 MG capsule Commonly known as: NEURONTIN   gemfibrozil 600 MG tablet Commonly known as: LOPID Take 600 mg by mouth 2 (two) times daily.   glimepiride 1 MG tablet Commonly known as: AMARYL Take 0.5 mg by mouth  daily as needed. For blood sugar greater than 140.   multivitamin with minerals Tabs tablet Take 1 tablet by mouth daily. Multivitamins for Senior 50+   omeprazole 20 MG capsule Commonly known as: PRILOSEC Take 20 mg by mouth daily.   PRESERVISION AREDS 2 PO Take 1 tablet by mouth daily.   ramipril 10 MG capsule Commonly known as: ALTACE Take 10 mg by mouth 2 (two) times daily.   tetrahydrozoline 0.05 % ophthalmic solution Place 1 drop into both eyes 2 (two) times daily as needed (for scratchy eyes).   tetrahydrozoline-zinc 0.05-0.25 % ophthalmic solution Commonly known as: VISINE-AC 2 drops 3 (three) times daily as needed.   traMADol 50 MG tablet Commonly known as: ULTRAM Take 50 mg by  mouth 5 (five) times daily as needed. For back pain.        Allergies:  Allergies  Allergen Reactions   Bee Venom Shortness Of Breath    Difficulty breathing    Lovastatin     Other reaction(s): Abdominal Pain   Chlorthalidone Other (See Comments)    Unsure of reaction   Rosuvastatin     Other reaction(s): Abdominal Pain Patient stated it also made him really nervous     Family History: Family History  Problem Relation Age of Onset   Cancer Father    Cancer Mother     Social History:  reports that he quit smoking about 20 years ago. His smoking use included cigarettes. His smokeless tobacco use includes chew. He reports current alcohol use. He reports that he does not use drugs.   Physical Exam: BP 134/67   Pulse 80   Ht '5\' 10"'$  (1.778 m)   Wt 180 lb (81.6 kg)   BMI 25.83 kg/m   Constitutional:  Alert and oriented, No acute distress. HEENT: [place] AT Respiratory: Normal respiratory effort, no increased work of breathing. Psychiatric: Normal mood and affect.   Assessment & Plan:    1.  Left nephrolithiasis Asymptomatic KUB ordered and will call with results  Right scrotal content pain Stable  2.  History of prostate cancer Cryoablation 2006  Continue annual follow-up   Abbie Sons, MD  [facility] [address] [place], [place] [address] [phone]

## 2022-10-01 DIAGNOSIS — M48062 Spinal stenosis, lumbar region with neurogenic claudication: Secondary | ICD-10-CM | POA: Diagnosis not present

## 2022-10-01 DIAGNOSIS — M5416 Radiculopathy, lumbar region: Secondary | ICD-10-CM | POA: Diagnosis not present

## 2022-10-01 DIAGNOSIS — Z79899 Other long term (current) drug therapy: Secondary | ICD-10-CM | POA: Diagnosis not present

## 2022-10-01 DIAGNOSIS — M5136 Other intervertebral disc degeneration, lumbar region: Secondary | ICD-10-CM | POA: Diagnosis not present

## 2022-10-07 ENCOUNTER — Ambulatory Visit
Admission: RE | Admit: 2022-10-07 | Discharge: 2022-10-07 | Disposition: A | Discharge status: Home / Self Care | Payer: PPO | Source: Ambulatory Visit | Attending: Urology | Admitting: Urology

## 2022-10-07 ENCOUNTER — Ambulatory Visit
Admission: RE | Admit: 2022-10-07 | Discharge: 2022-10-07 | Disposition: A | Discharge status: Home / Self Care | Payer: PPO | Attending: Urology | Admitting: Urology

## 2022-10-07 DIAGNOSIS — N200 Calculus of kidney: Secondary | ICD-10-CM

## 2022-10-07 DIAGNOSIS — I878 Other specified disorders of veins: Secondary | ICD-10-CM | POA: Diagnosis not present

## 2022-10-19 ENCOUNTER — Telehealth: Payer: Self-pay | Admitting: *Deleted

## 2022-10-19 NOTE — Telephone Encounter (Signed)
-----   Message from Abbie Sons, MD sent at 10/17/2022  9:50 PM EDT ----- KUB with stable left renal calculus

## 2022-10-19 NOTE — Telephone Encounter (Signed)
Notified patient as instructed, patient pleased °

## 2022-10-21 DIAGNOSIS — M5416 Radiculopathy, lumbar region: Secondary | ICD-10-CM | POA: Diagnosis not present

## 2022-10-21 DIAGNOSIS — M48062 Spinal stenosis, lumbar region with neurogenic claudication: Secondary | ICD-10-CM | POA: Diagnosis not present

## 2022-11-13 DIAGNOSIS — H2513 Age-related nuclear cataract, bilateral: Secondary | ICD-10-CM | POA: Diagnosis not present

## 2022-11-13 DIAGNOSIS — H353132 Nonexudative age-related macular degeneration, bilateral, intermediate dry stage: Secondary | ICD-10-CM | POA: Diagnosis not present

## 2022-11-26 ENCOUNTER — Ambulatory Visit
Admission: EM | Admit: 2022-11-26 | Discharge: 2022-11-26 | Disposition: A | Discharge status: Home / Self Care | Payer: PPO

## 2022-11-26 DIAGNOSIS — U071 COVID-19: Secondary | ICD-10-CM | POA: Diagnosis not present

## 2022-11-26 MED ORDER — MOLNUPIRAVIR EUA 200MG CAPSULE: 4.0000 | ORAL_CAPSULE | Freq: Two times a day (BID) | ORAL | 0 refills | Status: AC

## 2022-11-26 NOTE — Discharge Instructions (Signed)
COVID-19 is a virus and will improve with time  Begin use of antiviral medicine every morning and every evening for 5 days, this medicine reduces the amount of virus within your body which ideally will reduce your symptoms and the timeline that you are sick, does not fully take away illness but helps your improvement  Begin use of your Nasacort to help manage your congestion, may also continue use of Coricidin    You can take Tylenol and/or Ibuprofen as needed for fever reduction and pain relief.   For cough: honey 1/2 to 1 teaspoon (you can dilute the honey in water or another fluid).  You can also use guaifenesin and dextromethorphan for cough. You can use a humidifier for chest congestion and cough.  If you don't have a humidifier, you can sit in the bathroom with the hot shower running.      For sore throat: try warm salt water gargles, cepacol lozenges, throat spray, warm tea or water with lemon/honey, popsicles or ice, or OTC cold relief medicine for throat discomfort.   For congestion: take a daily anti-histamine like Zyrtec, Claritin, and a oral decongestant, such as pseudoephedrine.  You can also use Flonase 1-2 sprays in each nostril daily.   It is important to stay hydrated: drink plenty of fluids (water, gatorade/powerade/pedialyte, juices, or teas) to keep your throat moisturized and help further relieve irritation/discomfort.

## 2022-11-26 NOTE — ED Triage Notes (Signed)
Pt c/o sore throat, body aches, nasal congestion, temperature of 100.8 x3days.  Pt was around his wife who was diagnosed with covid on 11/25/22 and was given Molnupiravir.  Pt took a '325mg'$  tylenol this morning

## 2022-11-26 NOTE — ED Provider Notes (Signed)
[facility]    CSN: [id number] Arrival date & time: 11/26/22  0835      History   Chief Complaint Chief Complaint  Patient presents with   Generalized Body Aches   Sore Throat    HPI HARTMAN MINAHAN is a 86 y.o. male.   Patient presents for evaluation of fever, nasal congestion, rhinorrhea, postnasal drip, sore throat, nonproductive cough and generalized bodyaches for 3 days.  Known sick contact in household, wife tested positive for COVID 1 day ago.  Has attempted use of Coricidin which has been minimally helpful.  Tolerating food and liquids.  Denies shortness of breath, wheezing.  History of chronic sinusitis, COPD, diabetes, CAD.   Past Medical History:  Diagnosis Date   Arthritis    Bulging lumbar disc    Cancer ([place]) 02/24/2017   prostate   Chronic sinusitis    COPD (chronic obstructive pulmonary disease) (HCC)    Coronary artery disease    Degeneration, intervertebral disc, lumbosacral    Diabetes mellitus without complication (HCC)    GERD (gastroesophageal reflux disease)    Hemorrhoids    History of kidney stones    History of kidney stones    Hyperlipemia    Hypertension    Lumbar radiculitis    Lumbar stenosis with neurogenic claudication    Prostate cancer [facility])     Patient Active Problem List   Diagnosis Date Noted   Lumbar stenosis with neurogenic claudication 03/04/2017   Controlled type 2 diabetes mellitus with stage 3 chronic kidney disease, without long-term current use of insulin ([place]) 05/28/2016   Internal hemorrhoids 03/26/2016   Incomplete emptying of bladder 03/18/2016   Gastroesophageal reflux disease without esophagitis 11/28/2015   Benign essential hypertension 03/27/2015   Mixed hyperlipidemia 03/27/2015   COPD (chronic obstructive pulmonary disease) ([place]) 11/01/2014   CAD (coronary artery disease) 07/24/2014   Degeneration of intervertebral disc of lumbosacral region 07/09/2014   Bulging lumbar disc 06/25/2014   Low  back pain 06/25/2014   Bladder outlet obstruction 08/04/2013   Disorder of male genital organs 12/12/2012   Kidney stone 12/12/2012   Malignant neoplasm of prostate ([place]) 12/12/2012   Sciatica 12/12/2012   Spermatocele 12/12/2012    Past Surgical History:  Procedure Laterality Date   CARDIAC CATHETERIZATION     CHOLECYSTECTOMY  02/24/2017   20 years ago   COLONOSCOPY     COLONOSCOPY WITH PROPOFOL N/A 10/12/2018   Procedure: COLONOSCOPY WITH PROPOFOL;  Surgeon: Manya Silvas, MD;  Location: [facility] ENDOSCOPY;  Service: Endoscopy;  Laterality: N/A;   FEMUR FRACTURE SURGERY  50 years ago   FUNCTIONAL ENDOSCOPIC SINUS SURGERY     LEG SURGERY Left    ORIF    PROSTATE CRYOABLATION  2007       Home Medications    Prior to Admission medications   Medication Sig Start Date End Date Taking? Authorizing Provider  amLODipine (NORVASC) 5 MG tablet Take 5 mg by mouth 2 (two) times daily. 12/02/16  Yes [provider]  atorvastatin (LIPITOR) 10 MG tablet Take 10 mg by mouth daily.   Yes [provider]  cyclobenzaprine (FLEXERIL) 10 MG tablet Take 1 tablet (10 mg total) by mouth 3 (three) times daily as needed for muscle spasms. 03/06/17  Yes Newman Pies, MD  docusate sodium (COLACE) 100 MG capsule Take 1 capsule (100 mg total) by mouth 2 (two) times daily. 03/06/17  Yes Newman Pies, MD  gabapentin (NEURONTIN) 100 MG capsule  08/23/18  Yes  [provider]  glimepiride (AMARYL) 1 MG tablet Take 0.5 mg by mouth daily as needed. For blood sugar greater than 140.   Yes [provider]  Lancets (FREESTYLE) lancets 1 each by Other route once daily Dx E11.9 04/21/18  Yes [provider]  Multiple Vitamin (MULTIVITAMIN WITH MINERALS) TABS tablet Take 1 tablet by mouth daily. Multivitamins for Senior 50+   Yes [provider]  Multiple Vitamins-Minerals (PRESERVISION AREDS 2 PO) Take 1 tablet by mouth daily.   Yes [provider]   omeprazole (PRILOSEC) 20 MG capsule Take 20 mg by mouth daily. 12/24/16  Yes [provider]  ramipril (ALTACE) 10 MG capsule Take 10 mg by mouth 2 (two) times daily. 01/11/17  Yes [provider]  tetrahydrozoline 0.05 % ophthalmic solution Place 1 drop into both eyes 2 (two) times daily as needed (for scratchy eyes).   Yes [provider]  tetrahydrozoline-zinc (VISINE-AC) 0.05-0.25 % ophthalmic solution 2 drops 3 (three) times daily as needed.   Yes [provider]  traMADol (ULTRAM) 50 MG tablet Take 50 mg by mouth 5 (five) times daily as needed. For back pain. 01/11/17  Yes [provider]  azithromycin (ZITHROMAX) 250 MG tablet Take 1 tablet (250 mg total) by mouth daily. Take first 2 tablets together, then 1 every day until finished. 10/16/21   Danton Clap, PA-C  gemfibrozil (LOPID) 600 MG tablet Take 600 mg by mouth 2 (two) times daily.    [provider]    Family History Family History  Problem Relation Age of Onset   Cancer Father    Cancer Mother     Social History Social History   Tobacco Use   Smoking status: Former    Types: Cigarettes    Quit date: 02/24/2002    Years since quitting: 20.7   Smokeless tobacco: Current    Types: Chew  Vaping Use   Vaping Use: Never used  Substance Use Topics   Alcohol use: Yes    Comment: occasionally   Drug use: No     Allergies   Bee venom, Lovastatin, Chlorthalidone, and Rosuvastatin   Review of Systems Review of Systems  Constitutional:  Positive for fever. Negative for activity change, appetite change, chills, diaphoresis, fatigue and unexpected weight change.  HENT:  Positive for congestion, postnasal drip, rhinorrhea and sore throat. Negative for dental problem, drooling, ear discharge, ear pain, facial swelling, hearing loss, mouth sores, nosebleeds, sinus pressure, sinus pain, sneezing, tinnitus, trouble swallowing and voice change.   Respiratory:  Positive for  cough. Negative for apnea, choking, chest tightness, shortness of breath, wheezing and stridor.   Cardiovascular: Negative.   Gastrointestinal: Negative.   Skin: Negative.   Neurological: Negative.      Physical Exam Triage Vital Signs ED Triage Vitals  Enc Vitals Group     BP 11/26/22 0950 134/64     Pulse Rate 11/26/22 0950 87     Resp 11/26/22 0950 18     Temp 11/26/22 0950 99 F (37.2 C)     Temp Source 11/26/22 0950 Oral     SpO2 11/26/22 0950 96 %     Weight 11/26/22 0945 180 lb (81.6 kg)     Height 11/26/22 0945 '5\' 11"'$  (1.803 m)     Head Circumference --      Peak Flow --      Pain Score 11/26/22 0945 8     Pain Loc --      Pain  Edu? --      Excl. in [place]? --    No data found.  Updated Vital Signs BP 134/64 (BP Location: Left Arm)   Pulse 87   Temp 99 F (37.2 C) (Oral)   Resp 18   Ht '5\' 11"'$  (1.803 m)   Wt 180 lb (81.6 kg)   SpO2 96%   BMI 25.10 kg/m   Visual Acuity Right Eye Distance:   Left Eye Distance:   Bilateral Distance:    Right Eye Near:   Left Eye Near:    Bilateral Near:     Physical Exam Constitutional:      Appearance: Normal appearance.  HENT:     Head: Atraumatic.     Right Ear: Tympanic membrane, ear canal and external ear normal.     Left Ear: Tympanic membrane, ear canal and external ear normal.     Nose: Congestion and rhinorrhea present.     Mouth/Throat:     Pharynx: Posterior oropharyngeal erythema present.     Tonsils: No tonsillar exudate. 0 on the right. 0 on the left.  Eyes:     Extraocular Movements: Extraocular movements intact.  Cardiovascular:     Rate and Rhythm: Normal rate and regular rhythm.     Pulses: Normal pulses.     Heart sounds: Normal heart sounds.  Pulmonary:     Effort: Pulmonary effort is normal.     Breath sounds: Normal breath sounds.  Skin:    General: Skin is warm and dry.  Neurological:     Mental Status: He is alert and oriented to person, place, and time. Mental status is at baseline.   Psychiatric:        Mood and Affect: Mood normal.        Behavior: Behavior normal.      UC Treatments / Results  Labs (all labs ordered are listed, but only abnormal results are displayed) Labs Reviewed - No data to display  EKG   Radiology No results found.  Procedures Procedures (including critical care time)  Medications Ordered in UC Medications - No data to display  Initial Impression / Assessment and Plan / UC Course  I have reviewed the triage vital signs and the nursing notes.  Pertinent labs & imaging results that were available during my care of the patient were reviewed by me and considered in my medical decision making (see chart for details).  COVID-19  As sick contact is in household will not complete PCR as this is most likely viral etiology, discussed with patient, vitals are stable, lungs are clear and O2 saturation is greater than 90% on room air, antiviral prescribed I discussed quarantine guidelines with patient, advised beginning use of his Nasacort which he uses at baseline but stopped recently, may continue use of Coricidin as well as any additional over-the-counter medicines as needed for supportive care, given strict precautions to follow-up with urgent care for any difficulty breathing  Final Clinical Impressions(s) / UC Diagnoses   Final diagnoses:  None   Discharge Instructions   None    ED Prescriptions   None    PDMP not reviewed this encounter.   Hans Eden, NP 11/26/22 1016

## 2022-12-10 ENCOUNTER — Telehealth: Payer: Self-pay | Admitting: *Deleted

## 2022-12-10 ENCOUNTER — Encounter: Payer: Self-pay | Admitting: *Deleted

## 2022-12-10 NOTE — Patient Outreach (Signed)
  Care Coordination   Initial Visit Note   12/10/2022 Name: Steven Wolfe MRN: [id number] DOB: [birth year]  Steven Wolfe is a 86 y.o. year old male who sees Baxter Hire, MD for primary care. I spoke with  [name] by phone today.  What matters to the patients health and wellness today?  Continued management of diabetes.  Was seen in the ED recently for Covid infection, doing much better now.     Goals Addressed             This Visit's Progress    Effective management of DM       Care Coordination Interventions: Provided education to patient about basic DM disease process Reviewed medications with patient and discussed importance of medication adherence Counseled on importance of regular laboratory monitoring as prescribed Discussed plans with patient for ongoing care management follow up and provided patient with direct contact information for care management team Reviewed scheduled/upcoming provider appointments including: Labs on 2/14 and PCP on 2/21  Advised patient, providing education and rationale, to check cbg daily  and record, calling PCP for findings outside established parameters Screening for signs and symptoms of depression related to chronic disease state  Assessed social determinant of health barriers Reviewed A1C of 7.3, goal of less than 7         SDOH assessments and interventions completed:  Yes  SDOH Interventions Today    Flowsheet Row Most Recent Value  SDOH Interventions   Food Insecurity Interventions Intervention Not Indicated  Housing Interventions Intervention Not Indicated  Transportation Interventions Intervention Not Indicated        Care Coordination Interventions:  Yes, provided   Follow up plan: Follow up call scheduled for 3/8    Encounter Outcome:  Pt. Visit Completed   Valente David, RN, MSN, [place] Care Management Care Management Coordinator [phone]

## 2023-01-27 DIAGNOSIS — N183 Chronic kidney disease, stage 3 unspecified: Secondary | ICD-10-CM | POA: Diagnosis not present

## 2023-01-27 DIAGNOSIS — E1122 Type 2 diabetes mellitus with diabetic chronic kidney disease: Secondary | ICD-10-CM | POA: Diagnosis not present

## 2023-02-03 DIAGNOSIS — J449 Chronic obstructive pulmonary disease, unspecified: Secondary | ICD-10-CM | POA: Diagnosis not present

## 2023-02-03 DIAGNOSIS — Z0000 Encounter for general adult medical examination without abnormal findings: Secondary | ICD-10-CM | POA: Diagnosis not present

## 2023-02-03 DIAGNOSIS — K219 Gastro-esophageal reflux disease without esophagitis: Secondary | ICD-10-CM | POA: Diagnosis not present

## 2023-02-03 DIAGNOSIS — C61 Malignant neoplasm of prostate: Secondary | ICD-10-CM | POA: Diagnosis not present

## 2023-02-03 DIAGNOSIS — Z23 Encounter for immunization: Secondary | ICD-10-CM | POA: Diagnosis not present

## 2023-02-03 DIAGNOSIS — N183 Chronic kidney disease, stage 3 unspecified: Secondary | ICD-10-CM | POA: Diagnosis not present

## 2023-02-03 DIAGNOSIS — E1122 Type 2 diabetes mellitus with diabetic chronic kidney disease: Secondary | ICD-10-CM | POA: Diagnosis not present

## 2023-02-03 DIAGNOSIS — I10 Essential (primary) hypertension: Secondary | ICD-10-CM | POA: Diagnosis not present

## 2023-02-03 DIAGNOSIS — E782 Mixed hyperlipidemia: Secondary | ICD-10-CM | POA: Diagnosis not present

## 2023-02-03 DIAGNOSIS — I2510 Atherosclerotic heart disease of native coronary artery without angina pectoris: Secondary | ICD-10-CM | POA: Diagnosis not present

## 2023-02-18 DIAGNOSIS — H2513 Age-related nuclear cataract, bilateral: Secondary | ICD-10-CM | POA: Diagnosis not present

## 2023-02-18 DIAGNOSIS — E119 Type 2 diabetes mellitus without complications: Secondary | ICD-10-CM | POA: Diagnosis not present

## 2023-02-18 DIAGNOSIS — H353132 Nonexudative age-related macular degeneration, bilateral, intermediate dry stage: Secondary | ICD-10-CM | POA: Diagnosis not present

## 2023-02-19 ENCOUNTER — Ambulatory Visit: Payer: Self-pay | Admitting: *Deleted

## 2023-02-19 NOTE — Patient Outreach (Signed)
  Care Coordination   02/19/2023 Name: Steven Wolfe MRN: [id number] DOB: 10-30-1936   Care Coordination Outreach Attempts:  An unsuccessful telephone outreach was attempted for a scheduled appointment today.  Follow Up Plan:  Additional outreach attempts will be made to offer the patient care coordination information and services.   Encounter Outcome:  No Answer   Care Coordination Interventions:  Yes, provided    Valente David, RN, MSN, [place] Care Management Care Management Coordinator [phone]

## 2023-02-19 NOTE — Patient Outreach (Signed)
  Care Coordination   Follow Up Visit Note   02/19/2023 Name: Steven Wolfe MRN: [id number] DOB: [birth year]  Steven Wolfe is a 87 y.o. year old male who sees Steven Hire, MD for primary care. I spoke with  Steven Wolfe by phone today.  What matters to the patients health and wellness today?  Remaining stable with chronic conditions.  Denies any urgent concerns, encouraged to contact this care manager with questions.      Goals Addressed             This Visit's Progress    COMPLETED: Effective management of DM   On track    Care Coordination Interventions: Provided education to patient about basic DM disease process Reviewed medications with patient and discussed importance of medication adherence Counseled on importance of regular laboratory monitoring as prescribed Discussed plans with patient for ongoing care management follow up and provided patient with direct contact information for care management team Reviewed scheduled/upcoming provider appointments including: AWV done on 2/21, PCP follow 8/25  Advised patient, providing education and rationale, to check cbg daily  and record, calling PCP for findings outside established parameters Reviewed A1C of 7.3, goal of less than 7, will continue to work on keeping it managed.  Today's blood sugar was 128         SDOH assessments and interventions completed:  No     Care Coordination Interventions:  Yes, provided   Follow up plan: No further intervention required.   Encounter Outcome:  Pt. Visit Completed   Valente David, RN, MSN, [place] Care Management Care Management Coordinator [phone]

## 2023-03-08 DIAGNOSIS — H2511 Age-related nuclear cataract, right eye: Secondary | ICD-10-CM | POA: Diagnosis not present

## 2023-03-08 DIAGNOSIS — H2512 Age-related nuclear cataract, left eye: Secondary | ICD-10-CM | POA: Diagnosis not present

## 2023-03-11 ENCOUNTER — Encounter: Payer: Self-pay | Admitting: Ophthalmology

## 2023-03-22 NOTE — Discharge Instructions (Signed)

## 2023-03-24 ENCOUNTER — Ambulatory Visit
Admission: RE | Admit: 2023-03-24 | Discharge: 2023-03-24 | Disposition: A | Discharge status: Home / Self Care | Payer: PPO | Attending: Ophthalmology | Admitting: Ophthalmology

## 2023-03-24 ENCOUNTER — Ambulatory Visit: Payer: PPO | Admitting: Anesthesiology

## 2023-03-24 ENCOUNTER — Encounter: Payer: Self-pay | Admitting: Ophthalmology

## 2023-03-24 ENCOUNTER — Encounter
Admission: RE | Disposition: A | Discharge status: Home / Self Care | Payer: Self-pay | Source: Home / Self Care | Attending: Ophthalmology

## 2023-03-24 ENCOUNTER — Other Ambulatory Visit: Payer: Self-pay

## 2023-03-24 DIAGNOSIS — I10 Essential (primary) hypertension: Secondary | ICD-10-CM | POA: Diagnosis not present

## 2023-03-24 DIAGNOSIS — I2510 Atherosclerotic heart disease of native coronary artery without angina pectoris: Secondary | ICD-10-CM | POA: Insufficient documentation

## 2023-03-24 DIAGNOSIS — H2512 Age-related nuclear cataract, left eye: Secondary | ICD-10-CM | POA: Insufficient documentation

## 2023-03-24 DIAGNOSIS — Z87891 Personal history of nicotine dependence: Secondary | ICD-10-CM | POA: Diagnosis not present

## 2023-03-24 DIAGNOSIS — J449 Chronic obstructive pulmonary disease, unspecified: Secondary | ICD-10-CM | POA: Diagnosis not present

## 2023-03-24 DIAGNOSIS — E1136 Type 2 diabetes mellitus with diabetic cataract: Secondary | ICD-10-CM | POA: Diagnosis not present

## 2023-03-24 DIAGNOSIS — M5136 Other intervertebral disc degeneration, lumbar region: Secondary | ICD-10-CM | POA: Insufficient documentation

## 2023-03-24 HISTORY — PX: CATARACT EXTRACTION W/PHACO: SHX586

## 2023-03-24 LAB — GLUCOSE, CAPILLARY: Glucose-Capillary: 147 mg/dL — ABNORMAL HIGH (ref 70–99)

## 2023-03-24 SURGERY — PHACOEMULSIFICATION, CATARACT, WITH IOL INSERTION
Anesthesia: Monitor Anesthesia Care | Site: Eye | Laterality: Left

## 2023-03-24 MED ORDER — FENTANYL CITRATE (PF) 100 MCG/2ML IJ SOLN
INTRAMUSCULAR | Status: DC | PRN
  Administered 2023-03-24: 25 ug via INTRAVENOUS
  Administered 2023-03-24: 50 ug via INTRAVENOUS
  Administered 2023-03-24: 25 ug via INTRAVENOUS

## 2023-03-24 MED ORDER — LACTATED RINGERS IV SOLN: INTRAVENOUS | Status: DC

## 2023-03-24 MED ORDER — SIGHTPATH DOSE#1 BSS IO SOLN
INTRAOCULAR | Status: DC | PRN
  Administered 2023-03-24: 15 mL

## 2023-03-24 MED ORDER — ARMC OPHTHALMIC DILATING DROPS
1.0000 | OPHTHALMIC | Status: DC | PRN
  Administered 2023-03-24 (×3): 1 via OPHTHALMIC

## 2023-03-24 MED ORDER — CEFUROXIME OPHTHALMIC INJECTION 1 MG/0.1 ML
INJECTION | OPHTHALMIC | Status: DC | PRN
  Administered 2023-03-24: .1 mL via INTRACAMERAL

## 2023-03-24 MED ORDER — SIGHTPATH DOSE#1 BSS IO SOLN
INTRAOCULAR | Status: DC | PRN
  Administered 2023-03-24: 1 mL

## 2023-03-24 MED ORDER — BRIMONIDINE TARTRATE-TIMOLOL 0.2-0.5 % OP SOLN
OPHTHALMIC | Status: DC | PRN
  Administered 2023-03-24: 1 [drp] via OPHTHALMIC

## 2023-03-24 MED ORDER — SIGHTPATH DOSE#1 NA HYALUR & NA CHOND-NA HYALUR IO KIT
PACK | INTRAOCULAR | Status: DC | PRN
  Administered 2023-03-24: 1 via OPHTHALMIC

## 2023-03-24 MED ORDER — SIGHTPATH DOSE#1 BSS IO SOLN
INTRAOCULAR | Status: DC | PRN
  Administered 2023-03-24: 80 mL via OPHTHALMIC

## 2023-03-24 MED ORDER — TETRACAINE HCL 0.5 % OP SOLN
1.0000 [drp] | OPHTHALMIC | Status: DC | PRN
  Administered 2023-03-24 (×3): 1 [drp] via OPHTHALMIC

## 2023-03-24 SURGICAL SUPPLY — 19 items
CANNULA ANT/CHMB 27G (MISCELLANEOUS) IMPLANT
CANNULA ANT/CHMB 27GA (MISCELLANEOUS) IMPLANT
CATARACT SUITE SIGHTPATH (MISCELLANEOUS) ×1 IMPLANT
FEE CATARACT SUITE SIGHTPATH (MISCELLANEOUS) ×1 IMPLANT
GLOVE SRG 8 PF TXTR STRL LF DI (GLOVE) ×1 IMPLANT
GLOVE SURG ENC TEXT LTX SZ7.5 (GLOVE) ×1 IMPLANT
GLOVE SURG GAMMEX PI TX LF 7.5 (GLOVE) IMPLANT
GLOVE SURG UNDER POLY LF SZ8 (GLOVE) ×1
LENS IOL TECNIS EYHANCE 21.0 (Intraocular Lens) IMPLANT
NDL FILTER BLUNT 18X1 1/2 (NEEDLE) ×1 IMPLANT
NDL RETROBULBAR .5 NSTRL (NEEDLE) IMPLANT
NEEDLE FILTER BLUNT 18X1 1/2 (NEEDLE) ×1 IMPLANT
PACK VIT ANT 23G (MISCELLANEOUS) IMPLANT
RING MALYGIN 7.0 (MISCELLANEOUS) IMPLANT
SUT ETHILON 10-0 CS-B-6CS-B-6 (SUTURE)
SUT VICRYL  9 0 (SUTURE)
SUT VICRYL 9 0 (SUTURE) IMPLANT
SUTURE EHLN 10-0 CS-B-6CS-B-6 (SUTURE) IMPLANT
SYR 3ML LL SCALE MARK (SYRINGE) ×1 IMPLANT

## 2023-03-24 NOTE — Anesthesia Preprocedure Evaluation (Addendum)
Anesthesia Evaluation  Patient identified by MRN, date of birth, ID band Patient awake    Reviewed: Allergy & Precautions, H&P , NPO status , Patient's Chart, lab work & pertinent test results  Airway Mallampati: II  TM Distance: >3 FB Neck ROM: Full    Dental no notable dental hx.    Pulmonary COPD, former smoker   Pulmonary exam normal breath sounds clear to auscultation       Cardiovascular hypertension, + CAD  Normal cardiovascular exam Rhythm:Regular Rate:Normal     Neuro/Psych negative neurological ROS  negative psych ROS   GI/Hepatic Neg liver ROS,GERD  Controlled and Medicated,,  Endo/Other  diabetes, Type 2, Oral Hypoglycemic Agents    Renal/GU negative Renal ROSCKD stage III, hx renal calculi   Hx bladder outlet obstruction negative genitourinary   Musculoskeletal  (+) Arthritis , Osteoarthritis,  DDD LS spine   Abdominal   Peds negative pediatric ROS (+)  Hematology negative hematology ROS (+)   Anesthesia Other Findings Coronary artery disease Hypertension Diabetes mellitus without complication History of kidney stones Arthritis  Cancer Prostate cancer  History of kidney stones Hemorrhoids  Hyperlipemia GERD (gastroesophageal reflux disease)  Lumbar stenosis with neurogenic claudication Lumbar radiculitis  COPD (chronic obstructive pulmonary disease) Bulging lumbar disc  Degeneration, intervertebral disc, lumbosacral Chronic sinusitis     Reproductive/Obstetrics negative OB ROS                             Anesthesia Physical Anesthesia Plan  ASA: 3  Anesthesia Plan: MAC   Post-op Pain Management:    Induction: Intravenous  PONV Risk Score and Plan:   Airway Management Planned: Natural Airway and Nasal Cannula  Additional Equipment:   Intra-op Plan:   Post-operative Plan:   Informed Consent: I have reviewed the patients History and Physical,  chart, labs and discussed the procedure including the risks, benefits and alternatives for the proposed anesthesia with the patient or authorized representative who has indicated his/her understanding and acceptance.     Dental Advisory Given  Plan Discussed with: Anesthesiologist, CRNA and Surgeon  Anesthesia Plan Comments: (Patient consented for risks of anesthesia including but not limited to:  - adverse reactions to medications - damage to eyes, teeth, lips or other oral mucosa - nerve damage due to positioning  - sore throat or hoarseness - Damage to heart, brain, nerves, lungs, other parts of body or loss of life  Patient voiced understanding.)       Anesthesia Quick Evaluation

## 2023-03-24 NOTE — Anesthesia Postprocedure Evaluation (Signed)
Anesthesia Post Note  Patient: Steven Wolfe  Procedure(s) Performed: CATARACT EXTRACTION PHACO AND INTRAOCULAR LENS PLACEMENT (IOC) LEFT DIABETIC (Left: Eye)  Patient location during evaluation: PACU Anesthesia Type: MAC Level of consciousness: awake and alert Pain management: pain level controlled Vital Signs Assessment: post-procedure vital signs reviewed and stable Respiratory status: spontaneous breathing, nonlabored ventilation, respiratory function stable and patient connected to nasal cannula oxygen Cardiovascular status: stable and blood pressure returned to baseline Postop Assessment: no apparent nausea or vomiting Anesthetic complications: no   No notable events documented.   Last Vitals:  Vitals:   03/24/23 0824 03/24/23 0830  BP: 136/69 (!) 144/76  Pulse: 76 72  Resp: 18 14  Temp: (!) 36.3 C (!) 36.3 C  SpO2: 99% 98%    Last Pain:  Vitals:   03/24/23 0830  TempSrc:   PainSc: 0-No pain                 Nima Kemppainen C Joden Bonsall

## 2023-03-24 NOTE — Op Note (Signed)
OPERATIVE NOTE  Steven Wolfe [id number] 03/24/2023   PREOPERATIVE DIAGNOSIS:  Nuclear sclerotic cataract left eye. H25.12   POSTOPERATIVE DIAGNOSIS:    Nuclear sclerotic cataract left eye.     PROCEDURE:  Phacoemusification with posterior chamber intraocular lens placement of the left eye  Ultrasound time: Procedure(s) with comments: CATARACT EXTRACTION PHACO AND INTRAOCULAR LENS PLACEMENT (IOC) LEFT DIABETIC (Left) - 19.38 1:37.9  LENS:   Implant Name Type Inv. Item Serial No. Manufacturer Lot No. LRB No. Used Action  LENS IOL TECNIS EYHANCE 21.0 - [id number] Intraocular Lens LENS IOL TECNIS EYHANCE 21.0 [id number] [employer]  Left 1 Implanted      SURGEON:  Deirdre Evener, MD   ANESTHESIA:  Topical with tetracaine drops and 2% Xylocaine jelly, augmented with 1% preservative-free intracameral lidocaine.    COMPLICATIONS:  None.   DESCRIPTION OF PROCEDURE:  The patient was identified in the holding room and transported to the operating room and placed in the supine position under the operating microscope.  The left eye was identified as the operative eye and it was prepped and draped in the usual sterile ophthalmic fashion.   A 1 millimeter clear-corneal paracentesis was made at the 1:30 position.  0.5 ml of preservative-free 1% lidocaine was injected into the anterior chamber.  The anterior chamber was filled with Viscoat viscoelastic.  A 2.4 millimeter keratome was used to make a near-clear corneal incision at the 10:30 position.  .  A curvilinear capsulorrhexis was made with a cystotome and capsulorrhexis forceps.  Balanced salt solution was used to hydrodissect and hydrodelineate the nucleus.   Phacoemulsification was then used in stop and chop fashion to remove the lens nucleus and epinucleus.  The remaining cortex was then removed using the irrigation and aspiration handpiece. Provisc was then placed into the capsular bag to distend it for lens placement.  A lens was  then injected into the capsular bag.  The remaining viscoelastic was aspirated.   Wounds were hydrated with balanced salt solution.  The anterior chamber was inflated to a physiologic pressure with balanced salt solution.  No wound leaks were noted. Cefuroxime 0.1 ml of a 10mg /ml solution was injected into the anterior chamber for a dose of 1 mg of intracameral antibiotic at the completion of the case.   Timolol and Brimonidine drops were applied to the eye.  The patient was taken to the recovery room in stable condition without complications of anesthesia or surgery.  Steven Wolfe 03/24/2023, 8:23 AM

## 2023-03-24 NOTE — Transfer of Care (Signed)
Immediate Anesthesia Transfer of Care Note  Patient: Steven Wolfe  Procedure(s) Performed: CATARACT EXTRACTION PHACO AND INTRAOCULAR LENS PLACEMENT (IOC) LEFT DIABETIC (Left: Eye)  Patient Location: PACU  Anesthesia Type: MAC  Level of Consciousness: awake, alert  and patient cooperative  Airway and Oxygen Therapy: Patient Spontanous Breathing and Patient connected to supplemental oxygen  Post-op Assessment: Post-op Vital signs reviewed, Patient's Cardiovascular Status Stable, Respiratory Function Stable, Patent Airway and No signs of Nausea or vomiting  Post-op Vital Signs: Reviewed and stable  Complications: No notable events documented.

## 2023-03-24 NOTE — H&P (Signed)
[facility]   Primary Care Physician:  Gracelyn Nurse, MD Ophthalmologist: Dr. Lockie Mola  Pre-Procedure History & Physical: HPI:  Steven Wolfe is a 87 y.o. male here for ophthalmic surgery.   Past Medical History:  Diagnosis Date   Arthritis    Bulging lumbar disc    Cancer 02/24/2017   prostate   Chronic sinusitis    COPD (chronic obstructive pulmonary disease)    Coronary artery disease    Degeneration, intervertebral disc, lumbosacral    Diabetes mellitus without complication    GERD (gastroesophageal reflux disease)    Hemorrhoids    History of kidney stones    History of kidney stones    Hyperlipemia    Hypertension    Lumbar radiculitis    Lumbar stenosis with neurogenic claudication    Prostate cancer     Past Surgical History:  Procedure Laterality Date   CARDIAC CATHETERIZATION     CHOLECYSTECTOMY  02/24/2017   20 years ago   COLONOSCOPY     COLONOSCOPY WITH PROPOFOL N/A 10/12/2018   Procedure: COLONOSCOPY WITH PROPOFOL;  Surgeon: Scot Jun, MD;  Location: [facility] ENDOSCOPY;  Service: Endoscopy;  Laterality: N/A;   FEMUR FRACTURE SURGERY  50 years ago   FUNCTIONAL ENDOSCOPIC SINUS SURGERY     LEG SURGERY Left    ORIF    PROSTATE CRYOABLATION  2007    Prior to Admission medications   Medication Sig Start Date End Date Taking? Authorizing Provider  amLODipine (NORVASC) 5 MG tablet Take 5 mg by mouth 2 (two) times daily. 12/02/16  Yes [provider]  atorvastatin (LIPITOR) 10 MG tablet Take 10 mg by mouth daily.   Yes [provider]  docusate sodium (COLACE) 100 MG capsule Take 1 capsule (100 mg total) by mouth 2 (two) times daily. 03/06/17  Yes Tressie Stalker, MD  gemfibrozil (LOPID) 600 MG tablet Take 600 mg by mouth 2 (two) times daily.   Yes [provider]  glimepiride (AMARYL) 1 MG tablet Take 0.5 mg by mouth daily as needed. For blood sugar greater than 140.   Yes [provider]   Multiple Vitamin (MULTIVITAMIN WITH MINERALS) TABS tablet Take 1 tablet by mouth daily. Multivitamins for Senior 50+   Yes [provider]  Multiple Vitamins-Minerals (PRESERVISION AREDS 2 PO) Take 1 tablet by mouth daily.   Yes [provider]  omeprazole (PRILOSEC) 20 MG capsule Take 20 mg by mouth daily. 12/24/16  Yes [provider]  ramipril (ALTACE) 10 MG capsule Take 10 mg by mouth 2 (two) times daily. 01/11/17  Yes [provider]  tetrahydrozoline 0.05 % ophthalmic solution Place 1 drop into both eyes 2 (two) times daily as needed (for scratchy eyes).   Yes [provider]  traMADol (ULTRAM) 50 MG tablet Take 50 mg by mouth 5 (five) times daily as needed. For back pain. 01/11/17  Yes [provider]  azithromycin (ZITHROMAX) 250 MG tablet Take 1 tablet (250 mg total) by mouth daily. Take first 2 tablets together, then 1 every day until finished. Patient not taking: Reported on 03/11/2023 10/16/21   Shirlee Latch, PA-C  cyclobenzaprine (FLEXERIL) 10 MG tablet Take 1 tablet (10 mg total) by mouth 3 (three) times daily as needed for muscle spasms. Patient not taking: Reported on 03/11/2023 03/06/17   Tressie Stalker, MD  gabapentin (NEURONTIN) 100 MG capsule  08/23/18   [provider]  Lancets (FREESTYLE) lancets 1 each by Other route once  daily Dx E11.9 04/21/18   [provider]  tetrahydrozoline-zinc (VISINE-AC) 0.05-0.25 % ophthalmic solution 2 drops 3 (three) times daily as needed. Patient not taking: Reported on 03/11/2023    [provider]    Allergies as of 02/23/2023 - Review Complete 12/10/2022  Allergen Reaction Noted   Bee venom Shortness Of Breath 02/22/2017   Lovastatin  11/06/2019   Chlorthalidone Other (See Comments) 02/19/2015   Rosuvastatin  05/08/2020    Family History  Problem Relation Age of Onset   Cancer Father    Cancer Mother     Social History   Socioeconomic History   Marital  status: Married    Spouse name: Not on file   Number of children: Not on file   Years of education: Not on file   Highest education level: Not on file  Occupational History   Not on file  Tobacco Use   Smoking status: Former    Types: Cigarettes    Quit date: 02/24/2002    Years since quitting: 21.0   Smokeless tobacco: Current    Types: Chew  Vaping Use   Vaping Use: Never used  Substance and Sexual Activity   Alcohol use: Yes    Comment: occasionally   Drug use: No   Sexual activity: Yes  Other Topics Concern   Not on file  Social History Narrative   Not on file   Social Determinants of Health   Financial Resource Strain: Not on file  Food Insecurity: No Food Insecurity (12/10/2022)   Hunger Vital Sign    Worried About Running Out of Food in the Last Year: Never true    Ran Out of Food in the Last Year: Never true  Transportation Needs: No Transportation Needs (12/10/2022)   PRAPARE - Administrator, Civil Service (Medical): No    Lack of Transportation (Non-Medical): No  Physical Activity: Not on file  Stress: Not on file  Social Connections: Not on file  Intimate Partner Violence: Not on file    Review of Systems: See HPI, otherwise negative ROS  Physical Exam: BP (!) 150/75   Pulse 78   Temp 98.2 F (36.8 C) (Temporal)   Resp 18   Ht 5\' 11"  (1.803 m)   Wt 83 kg   SpO2 96%   BMI 25.52 kg/m  General:   Alert,  pleasant and cooperative in NAD Head:  Normocephalic and atraumatic. Lungs:  Clear to auscultation.    Heart:  Regular rate and rhythm.   Impression/Plan: Steven Wolfe is here for ophthalmic surgery.  Risks, benefits, limitations, and alternatives regarding ophthalmic surgery have been reviewed with the patient.  Questions have been answered.  All parties agreeable.   Lockie Mola, MD  03/24/2023, 7:28 AM

## 2023-03-25 ENCOUNTER — Encounter: Payer: Self-pay | Admitting: Ophthalmology

## 2023-03-25 DIAGNOSIS — H2511 Age-related nuclear cataract, right eye: Secondary | ICD-10-CM | POA: Diagnosis not present

## 2023-03-31 ENCOUNTER — Encounter: Payer: Self-pay | Admitting: Ophthalmology

## 2023-03-31 NOTE — Anesthesia Preprocedure Evaluation (Addendum)
Anesthesia Evaluation  Patient identified by MRN, date of birth, ID band Patient awake    Reviewed: Allergy & Precautions, H&P , NPO status , Patient's Chart, lab work & pertinent test results  Airway Mallampati: III  TM Distance: >3 FB Neck ROM: Full    Dental no notable dental hx.    Pulmonary neg pulmonary ROS, COPD, former smoker   Pulmonary exam normal breath sounds clear to auscultation       Cardiovascular hypertension, + CAD  Normal cardiovascular exam Rhythm:Regular Rate:Normal     Neuro/Psych negative neurological ROS  negative psych ROS   GI/Hepatic Neg liver ROS,GERD  ,,  Endo/Other  diabetes, Type 2, Oral Hypoglycemic Agents    Renal/GU Renal diseaseState III CKD   Bladder outlet obstruction negative genitourinary   Musculoskeletal negative musculoskeletal ROS (+) Arthritis ,  Back pain with radiculopathy   Abdominal   Peds negative pediatric ROS (+)  Hematology negative hematology ROS (+)   Anesthesia Other Findings Coronary artery disease Hypertension Diabetes mellitus without complication History of kidney stones Arthritis  Prostate cancer  History of kidney stones Hemorrhoids  Hyperlipemia GERD (gastroesophageal reflux disease) Lumbar stenosis with neurogenic claudication Lumbar radiculitis  COPD (chronic obstructive pulmonary disease) Bulging lumbar disc  Degeneration, intervertebral disc, lumbosacral Chronic sinusitis  CKD (chronic kidney disease), stage III    Reproductive/Obstetrics negative OB ROS                             Anesthesia Physical Anesthesia Plan  ASA: 3  Anesthesia Plan: MAC   Post-op Pain Management:    Induction: Intravenous  PONV Risk Score and Plan:   Airway Management Planned: Natural Airway and Nasal Cannula  Additional Equipment:   Intra-op Plan:   Post-operative Plan:   Informed Consent: I have reviewed the  patients History and Physical, chart, labs and discussed the procedure including the risks, benefits and alternatives for the proposed anesthesia with the patient or authorized representative who has indicated his/her understanding and acceptance.     Dental Advisory Given  Plan Discussed with: Anesthesiologist, CRNA and Surgeon  Anesthesia Plan Comments: (Patient consented for risks of anesthesia including but not limited to:  - adverse reactions to medications - damage to eyes, teeth, lips or other oral mucosa - nerve damage due to positioning  - sore throat or hoarseness - Damage to heart, brain, nerves, lungs, other parts of body or loss of life  Patient voiced understanding.)        Anesthesia Quick Evaluation

## 2023-04-05 DIAGNOSIS — M48062 Spinal stenosis, lumbar region with neurogenic claudication: Secondary | ICD-10-CM | POA: Diagnosis not present

## 2023-04-05 DIAGNOSIS — M5416 Radiculopathy, lumbar region: Secondary | ICD-10-CM | POA: Diagnosis not present

## 2023-04-05 DIAGNOSIS — Z79899 Other long term (current) drug therapy: Secondary | ICD-10-CM | POA: Diagnosis not present

## 2023-04-05 DIAGNOSIS — M5136 Other intervertebral disc degeneration, lumbar region: Secondary | ICD-10-CM | POA: Diagnosis not present

## 2023-04-05 NOTE — Discharge Instructions (Signed)

## 2023-04-07 ENCOUNTER — Encounter
Admission: RE | Disposition: A | Discharge status: Home / Self Care | Payer: Self-pay | Source: Home / Self Care | Attending: Ophthalmology

## 2023-04-07 ENCOUNTER — Ambulatory Visit: Payer: PPO | Admitting: Anesthesiology

## 2023-04-07 ENCOUNTER — Encounter: Payer: Self-pay | Admitting: Ophthalmology

## 2023-04-07 ENCOUNTER — Other Ambulatory Visit: Payer: Self-pay

## 2023-04-07 ENCOUNTER — Ambulatory Visit
Admission: RE | Admit: 2023-04-07 | Discharge: 2023-04-07 | Disposition: A | Discharge status: Home / Self Care | Payer: PPO | Attending: Ophthalmology | Admitting: Ophthalmology

## 2023-04-07 DIAGNOSIS — E1136 Type 2 diabetes mellitus with diabetic cataract: Secondary | ICD-10-CM | POA: Insufficient documentation

## 2023-04-07 DIAGNOSIS — K219 Gastro-esophageal reflux disease without esophagitis: Secondary | ICD-10-CM | POA: Insufficient documentation

## 2023-04-07 DIAGNOSIS — Z87891 Personal history of nicotine dependence: Secondary | ICD-10-CM | POA: Diagnosis not present

## 2023-04-07 DIAGNOSIS — I129 Hypertensive chronic kidney disease with stage 1 through stage 4 chronic kidney disease, or unspecified chronic kidney disease: Secondary | ICD-10-CM | POA: Diagnosis not present

## 2023-04-07 DIAGNOSIS — Z7984 Long term (current) use of oral hypoglycemic drugs: Secondary | ICD-10-CM | POA: Insufficient documentation

## 2023-04-07 DIAGNOSIS — E1122 Type 2 diabetes mellitus with diabetic chronic kidney disease: Secondary | ICD-10-CM | POA: Insufficient documentation

## 2023-04-07 DIAGNOSIS — Z09 Encounter for follow-up examination after completed treatment for conditions other than malignant neoplasm: Secondary | ICD-10-CM | POA: Diagnosis not present

## 2023-04-07 DIAGNOSIS — E785 Hyperlipidemia, unspecified: Secondary | ICD-10-CM | POA: Insufficient documentation

## 2023-04-07 DIAGNOSIS — J449 Chronic obstructive pulmonary disease, unspecified: Secondary | ICD-10-CM | POA: Insufficient documentation

## 2023-04-07 DIAGNOSIS — H2511 Age-related nuclear cataract, right eye: Secondary | ICD-10-CM | POA: Insufficient documentation

## 2023-04-07 DIAGNOSIS — I2510 Atherosclerotic heart disease of native coronary artery without angina pectoris: Secondary | ICD-10-CM | POA: Diagnosis not present

## 2023-04-07 DIAGNOSIS — N183 Chronic kidney disease, stage 3 unspecified: Secondary | ICD-10-CM | POA: Insufficient documentation

## 2023-04-07 HISTORY — PX: CATARACT EXTRACTION W/PHACO: SHX586

## 2023-04-07 HISTORY — DX: Chronic kidney disease, stage 3 unspecified: N18.30

## 2023-04-07 LAB — GLUCOSE, CAPILLARY: Glucose-Capillary: 131 mg/dL — ABNORMAL HIGH (ref 70–99)

## 2023-04-07 SURGERY — PHACOEMULSIFICATION, CATARACT, WITH IOL INSERTION
Anesthesia: Monitor Anesthesia Care | Site: Eye | Laterality: Right

## 2023-04-07 MED ORDER — CEFUROXIME OPHTHALMIC INJECTION 1 MG/0.1 ML
INJECTION | OPHTHALMIC | Status: DC | PRN
  Administered 2023-04-07: .1 mL via INTRACAMERAL

## 2023-04-07 MED ORDER — BRIMONIDINE TARTRATE-TIMOLOL 0.2-0.5 % OP SOLN
OPHTHALMIC | Status: DC | PRN
  Administered 2023-04-07: 1 [drp] via OPHTHALMIC

## 2023-04-07 MED ORDER — SIGHTPATH DOSE#1 BSS IO SOLN
INTRAOCULAR | Status: DC | PRN
  Administered 2023-04-07: 1 mL

## 2023-04-07 MED ORDER — SIGHTPATH DOSE#1 NA HYALUR & NA CHOND-NA HYALUR IO KIT
PACK | INTRAOCULAR | Status: DC | PRN
  Administered 2023-04-07: 1 via OPHTHALMIC

## 2023-04-07 MED ORDER — SIGHTPATH DOSE#1 BSS IO SOLN
INTRAOCULAR | Status: DC | PRN
  Administered 2023-04-07: 15 mL

## 2023-04-07 MED ORDER — SIGHTPATH DOSE#1 BSS IO SOLN
INTRAOCULAR | Status: DC | PRN
  Administered 2023-04-07: 80 mL via OPHTHALMIC

## 2023-04-07 MED ORDER — ARMC OPHTHALMIC DILATING DROPS
1.0000 | OPHTHALMIC | Status: DC | PRN
  Administered 2023-04-07 (×2): 1 via OPHTHALMIC

## 2023-04-07 MED ORDER — TETRACAINE HCL 0.5 % OP SOLN
1.0000 [drp] | OPHTHALMIC | Status: DC | PRN
  Administered 2023-04-07 (×3): 1 [drp] via OPHTHALMIC

## 2023-04-07 MED ORDER — LACTATED RINGERS IV SOLN: INTRAVENOUS | Status: DC

## 2023-04-07 MED ORDER — FENTANYL CITRATE (PF) 100 MCG/2ML IJ SOLN
INTRAMUSCULAR | Status: DC | PRN
  Administered 2023-04-07 (×2): 50 ug via INTRAVENOUS

## 2023-04-07 SURGICAL SUPPLY — 19 items
CANNULA ANT/CHMB 27G (MISCELLANEOUS) IMPLANT
CANNULA ANT/CHMB 27GA (MISCELLANEOUS) IMPLANT
CATARACT SUITE SIGHTPATH (MISCELLANEOUS) ×1 IMPLANT
FEE CATARACT SUITE SIGHTPATH (MISCELLANEOUS) ×1 IMPLANT
GLOVE SRG 8 PF TXTR STRL LF DI (GLOVE) ×1 IMPLANT
GLOVE SURG ENC TEXT LTX SZ7.5 (GLOVE) ×1 IMPLANT
GLOVE SURG GAMMEX PI TX LF 7.5 (GLOVE) IMPLANT
GLOVE SURG UNDER POLY LF SZ8 (GLOVE) ×1
LENS IOL TECNIS EYHANCE 21.5 (Intraocular Lens) IMPLANT
NDL FILTER BLUNT 18X1 1/2 (NEEDLE) ×1 IMPLANT
NDL RETROBULBAR .5 NSTRL (NEEDLE) IMPLANT
NEEDLE FILTER BLUNT 18X1 1/2 (NEEDLE) ×1 IMPLANT
PACK VIT ANT 23G (MISCELLANEOUS) IMPLANT
RING MALYGIN 7.0 (MISCELLANEOUS) IMPLANT
SUT ETHILON 10-0 CS-B-6CS-B-6 (SUTURE)
SUT VICRYL  9 0 (SUTURE)
SUT VICRYL 9 0 (SUTURE) IMPLANT
SUTURE EHLN 10-0 CS-B-6CS-B-6 (SUTURE) IMPLANT
SYR 3ML LL SCALE MARK (SYRINGE) ×1 IMPLANT

## 2023-04-07 NOTE — H&P (Signed)
[facility]   Primary Care Physician:  Gracelyn Nurse, MD Ophthalmologist: Dr. Lockie Mola  Pre-Procedure History & Physical: HPI:  Steven Wolfe is a 87 y.o. male here for ophthalmic surgery.   Past Medical History:  Diagnosis Date   Arthritis    Bulging lumbar disc    Cancer 02/24/2017   prostate   Chronic sinusitis    CKD (chronic kidney disease), stage III    COPD (chronic obstructive pulmonary disease)    Coronary artery disease    Degeneration, intervertebral disc, lumbosacral    Diabetes mellitus without complication    GERD (gastroesophageal reflux disease)    Hemorrhoids    History of kidney stones    History of kidney stones    Hyperlipemia    Hypertension    Lumbar radiculitis    Lumbar stenosis with neurogenic claudication    Prostate cancer     Past Surgical History:  Procedure Laterality Date   CARDIAC CATHETERIZATION     CATARACT EXTRACTION W/PHACO Left 03/24/2023   Procedure: CATARACT EXTRACTION PHACO AND INTRAOCULAR LENS PLACEMENT (IOC) LEFT DIABETIC;  Surgeon: Lockie Mola, MD;  Location: [facility];  Service: Ophthalmology;  Laterality: Left;  19.38 1:37.9   CHOLECYSTECTOMY  02/24/2017   20 years ago   COLONOSCOPY     COLONOSCOPY WITH PROPOFOL N/A 10/12/2018   Procedure: COLONOSCOPY WITH PROPOFOL;  Surgeon: Scot Jun, MD;  Location: [facility] ENDOSCOPY;  Service: Endoscopy;  Laterality: N/A;   FEMUR FRACTURE SURGERY  50 years ago   FUNCTIONAL ENDOSCOPIC SINUS SURGERY     LEG SURGERY Left    ORIF    PROSTATE CRYOABLATION  2007    Prior to Admission medications   Medication Sig Start Date End Date Taking? Authorizing Provider  amLODipine (NORVASC) 5 MG tablet Take 5 mg by mouth 2 (two) times daily. 12/02/16  Yes [provider]  atorvastatin (LIPITOR) 10 MG tablet Take 10 mg by mouth daily.   Yes [provider]  docusate sodium (COLACE) 100 MG capsule Take 1 capsule (100 mg total) by  mouth 2 (two) times daily. 03/06/17  Yes Tressie Stalker, MD  glimepiride (AMARYL) 1 MG tablet Take 0.5 mg by mouth daily as needed. For blood sugar greater than 140.   Yes [provider]  Multiple Vitamin (MULTIVITAMIN WITH MINERALS) TABS tablet Take 1 tablet by mouth daily. Multivitamins for Senior 50+   Yes [provider]  Multiple Vitamins-Minerals (PRESERVISION AREDS 2 PO) Take 1 tablet by mouth daily.   Yes [provider]  omeprazole (PRILOSEC) 20 MG capsule Take 20 mg by mouth daily. 12/24/16  Yes [provider]  ramipril (ALTACE) 10 MG capsule Take 10 mg by mouth 2 (two) times daily. 01/11/17  Yes [provider]  tetrahydrozoline 0.05 % ophthalmic solution Place 1 drop into both eyes 2 (two) times daily as needed (for scratchy eyes).   Yes [provider]  traMADol (ULTRAM) 50 MG tablet Take 50 mg by mouth 5 (five) times daily as needed. For back pain. 01/11/17  Yes [provider]  Lancets (FREESTYLE) lancets 1 each by Other route once daily Dx E11.9 04/21/18   [provider]    Allergies as of 02/23/2023 - Review Complete 12/10/2022  Allergen Reaction Noted   Bee venom Shortness Of Breath 02/22/2017   Lovastatin  11/06/2019   Chlorthalidone Other (See Comments) 02/19/2015   Rosuvastatin  05/08/2020    Family History  Problem Relation Age of Onset  Cancer Father    Cancer Mother     Social History   Socioeconomic History   Marital status: Married    Spouse name: Not on file   Number of children: Not on file   Years of education: Not on file   Highest education level: Not on file  Occupational History   Not on file  Tobacco Use   Smoking status: Former    Types: Cigarettes    Quit date: 02/24/2002    Years since quitting: 21.1   Smokeless tobacco: Current    Types: Chew  Vaping Use   Vaping Use: Never used  Substance and Sexual Activity   Alcohol use: Yes    Comment: occasionally   Drug  use: No   Sexual activity: Yes  Other Topics Concern   Not on file  Social History Narrative   Not on file   Social Determinants of Health   Financial Resource Strain: Not on file  Food Insecurity: No Food Insecurity (12/10/2022)   Hunger Vital Sign    Worried About Running Out of Food in the Last Year: Never true    Ran Out of Food in the Last Year: Never true  Transportation Needs: No Transportation Needs (12/10/2022)   PRAPARE - Administrator, Civil Service (Medical): No    Lack of Transportation (Non-Medical): No  Physical Activity: Not on file  Stress: Not on file  Social Connections: Not on file  Intimate Partner Violence: Not on file    Review of Systems: See HPI, otherwise negative ROS  Physical Exam: BP 128/72   Pulse 72   Temp (!) 97.2 F (36.2 C) (Temporal)   Resp 20   Ht  (1.778 m)   Wt 82.1 kg   SpO2 95%   BMI 25.97 kg/m  General:   Alert,  pleasant and cooperative in NAD Head:  Normocephalic and atraumatic. Lungs:  Clear to auscultation.    Heart:  Regular rate and rhythm.   Impression/Plan: Steven Wolfe is here for ophthalmic surgery.  Risks, benefits, limitations, and alternatives regarding ophthalmic surgery have been reviewed with the patient.  Questions have been answered.  All parties agreeable.   Lockie Mola, MD  04/07/2023, 7:27 AM

## 2023-04-07 NOTE — Op Note (Signed)
LOCATION:  [facility]   PREOPERATIVE DIAGNOSIS:    Nuclear sclerotic cataract right eye. H25.11   POSTOPERATIVE DIAGNOSIS:  Nuclear sclerotic cataract right eye.     PROCEDURE:  Phacoemusification with posterior chamber intraocular lens placement of the right eye   ULTRASOUND TIME: Procedure(s) with comments: CATARACT EXTRACTION PHACO AND INTRAOCULAR LENS PLACEMENT (IOC) RIGHT DIABETIC (Right) - 21.02 1:06.6  LENS:   Implant Name Type Inv. Item Serial No. Manufacturer Lot No. LRB No. Used Action  LENS IOL TECNIS EYHANCE 21.5 - [id number] Intraocular Lens LENS IOL TECNIS EYHANCE 21.5 [id number] [employer]  Right 1 Implanted         SURGEON:  Deirdre Evener, MD   ANESTHESIA:  Topical with tetracaine drops and 2% Xylocaine jelly, augmented with 1% preservative-free intracameral lidocaine.    COMPLICATIONS:  None.   DESCRIPTION OF PROCEDURE:  The patient was identified in the holding room and transported to the operating room and placed in the supine position under the operating microscope.  The right eye was identified as the operative eye and it was prepped and draped in the usual sterile ophthalmic fashion.   A 1 millimeter clear-corneal paracentesis was made at the 12:00 position.  0.5 ml of preservative-free 1% lidocaine was injected into the anterior chamber. The anterior chamber was filled with Viscoat viscoelastic.  A 2.4 millimeter keratome was used to make a near-clear corneal incision at the 9:00 position.  A curvilinear capsulorrhexis was made with a cystotome and capsulorrhexis forceps.  Balanced salt solution was used to hydrodissect and hydrodelineate the nucleus.   Phacoemulsification was then used in stop and chop fashion to remove the lens nucleus and epinucleus.  The remaining cortex was then removed using the irrigation and aspiration handpiece. Provisc was then placed into the capsular bag to distend it for lens placement.  A lens was then injected  into the capsular bag.  The remaining viscoelastic was aspirated.   Wounds were hydrated with balanced salt solution.  The anterior chamber was inflated to a physiologic pressure with balanced salt solution.  No wound leaks were noted. Cefuroxime 0.1 ml of a /ml solution was injected into the anterior chamber for a dose of 1 mg of intracameral antibiotic at the completion of the case.   Timolol and Brimonidine drops were applied to the eye.  The patient was taken to the recovery room in stable condition without complications of anesthesia or surgery.   Mansoor Hillyard 04/07/2023, 7:54 AM

## 2023-04-07 NOTE — Transfer of Care (Signed)
Immediate Anesthesia Transfer of Care Note  Patient: Steven Wolfe  Procedure(s) Performed: CATARACT EXTRACTION PHACO AND INTRAOCULAR LENS PLACEMENT (IOC) RIGHT DIABETIC (Right: Eye)  Patient Location: PACU  Anesthesia Type: MAC  Level of Consciousness: awake, alert  and patient cooperative  Airway and Oxygen Therapy: Patient Spontanous Breathing and Patient connected to supplemental oxygen  Post-op Assessment: Post-op Vital signs reviewed, Patient's Cardiovascular Status Stable, Respiratory Function Stable, Patent Airway and No signs of Nausea or vomiting  Post-op Vital Signs: Reviewed and stable  Complications: No notable events documented.

## 2023-04-07 NOTE — Anesthesia Postprocedure Evaluation (Signed)
Anesthesia Post Note  Patient: Steven Wolfe  Procedure(s) Performed: CATARACT EXTRACTION PHACO AND INTRAOCULAR LENS PLACEMENT (IOC) RIGHT DIABETIC (Right: Eye)  Patient location during evaluation: PACU Anesthesia Type: MAC Level of consciousness: awake and alert Pain management: pain level controlled Vital Signs Assessment: post-procedure vital signs reviewed and stable Respiratory status: spontaneous breathing, nonlabored ventilation, respiratory function stable and patient connected to nasal cannula oxygen Cardiovascular status: stable and blood pressure returned to baseline Postop Assessment: no apparent nausea or vomiting Anesthetic complications: no   No notable events documented.   Last Vitals:  Vitals:   04/07/23 0754 04/07/23 0759  BP: 129/70 132/72  Pulse: 76 62  Resp: 20   Temp: 36.7 C 36.8 C  SpO2: 96% 96%    Last Pain:  Vitals:   04/07/23 0759  TempSrc:   PainSc: 0-No pain                 Marisue Humble

## 2023-04-27 DIAGNOSIS — Z961 Presence of intraocular lens: Secondary | ICD-10-CM | POA: Diagnosis not present

## 2023-05-12 DIAGNOSIS — M48062 Spinal stenosis, lumbar region with neurogenic claudication: Secondary | ICD-10-CM | POA: Diagnosis not present

## 2023-05-12 DIAGNOSIS — M5416 Radiculopathy, lumbar region: Secondary | ICD-10-CM | POA: Diagnosis not present

## 2023-06-08 ENCOUNTER — Ambulatory Visit: Payer: PPO | Admitting: Dermatology

## 2023-06-08 VITALS — BP 111/60 | HR 69

## 2023-06-08 DIAGNOSIS — L814 Other melanin hyperpigmentation: Secondary | ICD-10-CM

## 2023-06-08 DIAGNOSIS — L820 Inflamed seborrheic keratosis: Secondary | ICD-10-CM

## 2023-06-08 NOTE — Progress Notes (Signed)
   Follow-Up Visit   Subjective  Steven Wolfe is a 87 y.o. male who presents for the following: Spot on the left ankle x 3-4 months. Spot is growing and itchy, has used Neosporin on this area.   Patient accompanied by wife.   The following portions of the chart were reviewed this encounter and updated as appropriate: medications, allergies, medical history  Review of Systems:  No other skin or systemic complaints except as noted in HPI or Assessment and Plan.  Objective  Well appearing patient in no apparent distress; mood and affect are within normal limits.  A focused examination was performed of the following areas: Left leg Relevant physical exam findings are noted in the Assessment and Plan.  Left lateral ankle 1.0 CM waxy pink thin papule with keratotic rim       Assessment & Plan   Inflamed seborrheic keratosis Left lateral ankle  vs Irritated Porokeratosis.  Start TMC 0.1% Cream BID to AA until improved, no more than 4 weeks. Avoid applying to face, groin, and axilla. Use as directed. Long-term use can cause thinning of the skin. Pt's wife has tube at home that pt will use.   Recheck on follow-up. Consider cryotherapy vrs bx if not improved    LENTIGINES Exam: scattered tan macules Due to sun exposure Treatment Plan: Benign-appearing, observe. Recommend daily broad spectrum sunscreen SPF 30+ to sun-exposed areas, reapply every 2 hours as needed.  Call for any changes    Return as scheduled, for UBSE. Recheck left lateral ankle. Wendee Beavers, CMA, am acting as scribe for Willeen Niece, MD .   Documentation: I have reviewed the above documentation for accuracy and completeness, and I agree with the above.  Willeen Niece, MD

## 2023-06-08 NOTE — Patient Instructions (Addendum)
Start triamcinolone 0.1% cream - Apply to affected area on left ankle twice a day until improved, no longer than 4 weeks.  Avoid applying to face, groin, and axilla. Use as directed. Long-term use can cause thinning of the skin.   Due to recent changes in healthcare laws, you may see results of your pathology and/or laboratory studies on MyChart before the doctors have had a chance to review them. We understand that in some cases there may be results that are confusing or concerning to you. Please understand that not all results are received at the same time and often the doctors may need to interpret multiple results in order to provide you with the best plan of care or course of treatment. Therefore, we ask that you please give [place] 2 business days to thoroughly review all your results before contacting the office for clarification. Should we see a critical lab result, you will be contacted sooner.   If You Need Anything After Your Visit  If you have any questions or concerns for your doctor, please call our main line at [phone] and press option 4 to reach your doctor's medical assistant. If no one answers, please leave a voicemail as directed and we will return your call as soon as possible. Messages left after 4 pm will be answered the following business day.   You may also send [place] a message via MyChart. We typically respond to MyChart messages within 1-2 business days.  For prescription refills, please ask your pharmacy to contact our office. Our fax number is [phone].  If you have an urgent issue when the clinic is closed that cannot wait until the next business day, you can page your doctor at the number below.    Please note that while we do our best to be available for urgent issues outside of office hours, we are not available 24/7.   If you have an urgent issue and are unable to reach [place], you may choose to seek medical care at your doctor's office, retail clinic, urgent care  center, or emergency room.  If you have a medical emergency, please immediately call 911 or go to the emergency department.  Pager Numbers  - Dr. Gwen Pounds: [phone]  - Dr. Neale Burly: [phone]  - Dr. Roseanne Reno: [phone]  In the event of inclement weather, please call our main line at [phone] for an update on the status of any delays or closures.  Dermatology Medication Tips: Please keep the boxes that topical medications come in in order to help keep track of the instructions about where and how to use these. Pharmacies typically print the medication instructions only on the boxes and not directly on the medication tubes.   If your medication is too expensive, please contact our office at [phone] option 4 or send [place] a message through MyChart.   We are unable to tell what your co-pay for medications will be in advance as this is different depending on your insurance coverage. However, we may be able to find a substitute medication at lower cost or fill out paperwork to get insurance to cover a needed medication.   If a prior authorization is required to get your medication covered by your insurance company, please allow [place] 1-2 business days to complete this process.  Drug prices often vary depending on where the prescription is filled and some pharmacies may offer cheaper prices.  The website www.goodrx.com contains coupons for medications through different pharmacies. The prices here do not account for  what the cost may be with help from insurance (it may be cheaper with your insurance), but the website can give you the price if you did not use any insurance.  - You can print the associated coupon and take it with your prescription to the pharmacy.  - You may also stop by our office during regular business hours and pick up a GoodRx coupon card.  - If you need your prescription sent electronically to a different pharmacy, notify our office through [facility] or by  phone at [phone] option 4.     Si Usted Necesita Algo Despus de Su Visita  Tambin puede enviarnos un mensaje a travs de Pharmacist, community. Por lo general respondemos a los mensajes de MyChart en el transcurso de 1 a 2 das hbiles.  Para renovar recetas, por favor pida a su farmacia que se ponga en contacto con nuestra oficina. Harland Dingwall de fax es [place] [phone].  Si tiene un asunto urgente cuando la clnica est cerrada y que no puede esperar hasta el siguiente da hbil, puede llamar/localizar a su doctor(a) al nmero que aparece a continuacin.   Por favor, tenga en cuenta que aunque hacemos todo lo posible para estar disponibles para asuntos urgentes fuera del horario de [place], no estamos disponibles las 24 horas del da, los 7 das de la [place].   Si tiene un problema urgente y no puede comunicarse con nosotros, puede optar por buscar atencin mdica  en el consultorio de su doctor(a), en una clnica privada, en un centro de atencin urgente o en una sala de emergencias.  Si tiene Engineering geologist, por favor llame inmediatamente al 911 o vaya a la sala de emergencias.  Nmeros de bper  - Dr. Nehemiah Massed: [phone]  - Dra. Moye: [phone]  - Dra. Nicole Kindred: [phone]  En caso de inclemencias del [place], por favor llame a Johnsie Kindred principal al [phone] para una actualizacin sobre el [place] de cualquier retraso o cierre.  Consejos para la medicacin en dermatologa: Por favor, guarde las cajas en las que vienen los medicamentos de uso tpico para ayudarle a seguir las instrucciones sobre dnde y cmo usarlos. Las farmacias generalmente imprimen las instrucciones del medicamento slo en las cajas y no directamente en los tubos del [place].   Si su medicamento es muy caro, por favor, pngase en contacto con Zigmund Daniel llamando al [phone] y presione la opcin 4 o envenos un mensaje a travs de Pharmacist, community.   No podemos decirle cul ser su copago  por los medicamentos por adelantado ya que esto es diferente dependiendo de la cobertura de su seguro. Sin embargo, es posible que podamos encontrar un medicamento sustituto a Electrical engineer un formulario para que el seguro cubra el medicamento que se considera necesario.   Si se requiere una autorizacin previa para que su compaa de seguros [place] su medicamento, por favor permtanos de 1 a 2 das hbiles para completar este proceso.  Los precios de los medicamentos varan con frecuencia dependiendo del Environmental consultant de dnde se surte la receta y alguna farmacias pueden ofrecer precios ms baratos.  El sitio web www.goodrx.com tiene cupones para medicamentos de Airline pilot. Los precios aqu no tienen en cuenta lo que podra costar con la ayuda del seguro (puede ser ms barato con su seguro), pero el sitio web puede darle el precio si no utiliz Research scientist (physical sciences).  - Puede imprimir el cupn correspondiente y llevarlo con su receta a la farmacia.  - Tambin puede  pasar por nuestra oficina durante el horario de atencin regular y Charity fundraiser una tarjeta de cupones de GoodRx.  - Si necesita que su receta se enve electrnicamente a una farmacia diferente, informe a nuestra oficina a travs de MyChart de [place] o por telfono llamando al [phone] y presione la opcin 4.

## 2023-07-12 DIAGNOSIS — H903 Sensorineural hearing loss, bilateral: Secondary | ICD-10-CM | POA: Diagnosis not present

## 2023-07-12 DIAGNOSIS — R0982 Postnasal drip: Secondary | ICD-10-CM | POA: Diagnosis not present

## 2023-07-19 ENCOUNTER — Ambulatory Visit: Payer: PPO | Admitting: Dermatology

## 2023-07-19 VITALS — BP 149/73 | HR 81

## 2023-07-19 DIAGNOSIS — L578 Other skin changes due to chronic exposure to nonionizing radiation: Secondary | ICD-10-CM | POA: Diagnosis not present

## 2023-07-19 DIAGNOSIS — L814 Other melanin hyperpigmentation: Secondary | ICD-10-CM | POA: Diagnosis not present

## 2023-07-19 DIAGNOSIS — L719 Rosacea, unspecified: Secondary | ICD-10-CM | POA: Diagnosis not present

## 2023-07-19 DIAGNOSIS — D1801 Hemangioma of skin and subcutaneous tissue: Secondary | ICD-10-CM

## 2023-07-19 DIAGNOSIS — Q828 Other specified congenital malformations of skin: Secondary | ICD-10-CM | POA: Diagnosis not present

## 2023-07-19 DIAGNOSIS — W908XXA Exposure to other nonionizing radiation, initial encounter: Secondary | ICD-10-CM | POA: Diagnosis not present

## 2023-07-19 DIAGNOSIS — D692 Other nonthrombocytopenic purpura: Secondary | ICD-10-CM

## 2023-07-19 DIAGNOSIS — Z1283 Encounter for screening for malignant neoplasm of skin: Secondary | ICD-10-CM | POA: Diagnosis not present

## 2023-07-19 DIAGNOSIS — L821 Other seborrheic keratosis: Secondary | ICD-10-CM | POA: Diagnosis not present

## 2023-07-19 DIAGNOSIS — D229 Melanocytic nevi, unspecified: Secondary | ICD-10-CM

## 2023-07-19 NOTE — Patient Instructions (Signed)

## 2023-07-19 NOTE — Progress Notes (Signed)
Follow-Up Visit   Subjective  Steven Wolfe is a 87 y.o. male who presents for the following: Skin Cancer Screening and Upper Body Skin Exam  The patient presents for Upper Body Skin Exam (UBSE) for skin cancer screening and mole check. The patient has spots, moles and lesions to be evaluated, some may be new or changing and the patient may have concern these could be cancer.   The following portions of the chart were reviewed this encounter and updated as appropriate: medications, allergies, medical history  Review of Systems:  No other skin or systemic complaints except as noted in HPI or Assessment and Plan.  Objective  Well appearing patient in no apparent distress; mood and affect are within normal limits.  All skin waist up, and lower legs examined. Relevant physical exam findings are noted in the Assessment and Plan.  L lat ankle, R lat calf (2) 1.0 cm pink macule/patch with keratotic rim    Assessment & Plan   Porokeratosis (2) L lat ankle, R lat calf  Itching has improved with TMC cream  Benign-appearing.  Observation.  Call clinic for new or changing lesions.  Recommend daily use of broad spectrum spf 30+ sunscreen to sun-exposed areas.   May use TMC 0.1% cream QD-BID PRN itch.     Skin cancer screening performed today.  Actinic Damage - Chronic condition, secondary to cumulative UV/sun exposure - diffuse scaly erythematous macules with underlying dyspigmentation - Recommend daily broad spectrum sunscreen SPF 30+ to sun-exposed areas, reapply every 2 hours as needed.  - Staying in the shade or wearing long sleeves, sun glasses (UVA+UVB protection) and wide brim hats (4-inch brim around the entire circumference of the hat) are also recommended for sun protection.  - Call for new or changing lesions.  Lentigines, Seborrheic Keratoses, Hemangiomas - Benign normal skin lesions - Benign-appearing - Call for any changes  Melanocytic Nevi - Tan-brown and/or  pink-flesh-colored symmetric macules and papules - Benign appearing on exam today - Observation - Call clinic for new or changing moles - Recommend daily use of broad spectrum spf 30+ sunscreen to sun-exposed areas.   Purpura - Chronic; persistent and recurrent.  Treatable, but not curable. - Violaceous macules and patches - Benign - Related to trauma, age, sun damage and/or use of blood thinners, chronic use of topical and/or oral steroids - Observe - Can use OTC arnica containing moisturizer such as Dermend Bruise Formula if desired - Call for worsening or other concerns  ROSACEA Exam Mid face erythema with telangiectasias +/- scattered inflammatory papules  Chronic and persistent condition with duration or expected duration over one year. Condition is bothersome/symptomatic for patient. Currently flared.   Rosacea is a chronic progressive skin condition usually affecting the face of adults, causing redness and/or acne bumps. It is treatable but not curable. It sometimes affects the eyes (ocular rosacea) as well. It may respond to topical and/or systemic medication and can flare with stress, sun exposure, alcohol, exercise, topical steroids (including hydrocortisone/cortisone 10) and some foods.  Daily application of broad spectrum spf 30+ sunscreen to face is recommended to reduce flares.  Treatment Plan Patient defers Rx treatment today. Will send in Metrogel if condition worsens. Patient to call for prescription.   Return in about 1 year (around 07/18/2024) for UBSE.  Maylene Roes, CMA, am acting as scribe for Willeen Niece, MD .   Documentation: I have reviewed the above documentation for accuracy and completeness, and I agree with the above.  Willeen Niece, MD

## 2023-08-02 DIAGNOSIS — E1122 Type 2 diabetes mellitus with diabetic chronic kidney disease: Secondary | ICD-10-CM | POA: Diagnosis not present

## 2023-08-02 DIAGNOSIS — N183 Chronic kidney disease, stage 3 unspecified: Secondary | ICD-10-CM | POA: Diagnosis not present

## 2023-08-18 DIAGNOSIS — Z0001 Encounter for general adult medical examination with abnormal findings: Secondary | ICD-10-CM | POA: Diagnosis not present

## 2023-08-18 DIAGNOSIS — I2510 Atherosclerotic heart disease of native coronary artery without angina pectoris: Secondary | ICD-10-CM | POA: Diagnosis not present

## 2023-08-18 DIAGNOSIS — E782 Mixed hyperlipidemia: Secondary | ICD-10-CM | POA: Diagnosis not present

## 2023-08-18 DIAGNOSIS — E1122 Type 2 diabetes mellitus with diabetic chronic kidney disease: Secondary | ICD-10-CM | POA: Diagnosis not present

## 2023-08-18 DIAGNOSIS — J449 Chronic obstructive pulmonary disease, unspecified: Secondary | ICD-10-CM | POA: Diagnosis not present

## 2023-08-18 DIAGNOSIS — N183 Chronic kidney disease, stage 3 unspecified: Secondary | ICD-10-CM | POA: Diagnosis not present

## 2023-08-18 DIAGNOSIS — I10 Essential (primary) hypertension: Secondary | ICD-10-CM | POA: Diagnosis not present

## 2023-08-18 DIAGNOSIS — K219 Gastro-esophageal reflux disease without esophagitis: Secondary | ICD-10-CM | POA: Diagnosis not present

## 2023-08-24 DIAGNOSIS — H353132 Nonexudative age-related macular degeneration, bilateral, intermediate dry stage: Secondary | ICD-10-CM | POA: Diagnosis not present

## 2023-08-24 DIAGNOSIS — H04123 Dry eye syndrome of bilateral lacrimal glands: Secondary | ICD-10-CM | POA: Diagnosis not present

## 2023-08-24 DIAGNOSIS — Z961 Presence of intraocular lens: Secondary | ICD-10-CM | POA: Diagnosis not present

## 2023-09-08 ENCOUNTER — Other Ambulatory Visit: Payer: Self-pay | Admitting: Neurosurgery

## 2023-09-08 DIAGNOSIS — M4807 Spinal stenosis, lumbosacral region: Secondary | ICD-10-CM

## 2023-09-13 ENCOUNTER — Ambulatory Visit
Admission: RE | Admit: 2023-09-13 | Discharge: 2023-09-13 | Disposition: A | Discharge status: Home / Self Care | Payer: PPO | Source: Ambulatory Visit | Attending: Neurosurgery | Admitting: Neurosurgery

## 2023-09-13 DIAGNOSIS — M5136 Other intervertebral disc degeneration, lumbar region: Secondary | ICD-10-CM | POA: Diagnosis not present

## 2023-09-13 DIAGNOSIS — M4807 Spinal stenosis, lumbosacral region: Secondary | ICD-10-CM | POA: Diagnosis not present

## 2023-09-13 DIAGNOSIS — M5126 Other intervertebral disc displacement, lumbar region: Secondary | ICD-10-CM | POA: Diagnosis not present

## 2023-09-21 DIAGNOSIS — M4807 Spinal stenosis, lumbosacral region: Secondary | ICD-10-CM | POA: Diagnosis not present

## 2023-09-29 DIAGNOSIS — J019 Acute sinusitis, unspecified: Secondary | ICD-10-CM | POA: Diagnosis not present

## 2023-09-29 DIAGNOSIS — B9689 Other specified bacterial agents as the cause of diseases classified elsewhere: Secondary | ICD-10-CM | POA: Diagnosis not present

## 2023-09-29 DIAGNOSIS — J209 Acute bronchitis, unspecified: Secondary | ICD-10-CM | POA: Diagnosis not present

## 2023-10-06 ENCOUNTER — Encounter: Payer: Self-pay | Admitting: Urology

## 2023-10-06 ENCOUNTER — Ambulatory Visit: Payer: PPO | Admitting: Urology

## 2023-10-06 VITALS — BP 117/67 | HR 92 | Ht 71.0 in | Wt 180.0 lb

## 2023-10-06 DIAGNOSIS — N200 Calculus of kidney: Secondary | ICD-10-CM

## 2023-10-06 DIAGNOSIS — Z8546 Personal history of malignant neoplasm of prostate: Secondary | ICD-10-CM | POA: Diagnosis not present

## 2023-10-06 DIAGNOSIS — N5082 Scrotal pain: Secondary | ICD-10-CM

## 2023-10-06 NOTE — Progress Notes (Signed)
I, Maysun Anabel Bene, acting as a scribe for Riki Altes, MD., have documented all relevant documentation on the behalf of Riki Altes, MD, as directed by Riki Altes, MD while in the presence of Riki Altes, MD.  10/06/2023 11:50 AM   Steven Wolfe 01-21-1936 [id number]  Referring provider: Gracelyn Nurse, MD [address] [facility] [place],  [place] [address]  Chief Complaint  Patient presents with   Prostate Cancer   Urologic history: 1.  Prostate cancer Prostate cryoablation November 2006; Gleason score and PSA at time of diagnosis not available on record review Elected to discontinue PSA testing in 2021 (PSA stable 1.1)   2.  Nephrolithiasis CT 10/2021 with 19 mm nonobstructing left upper pole calculus-asymptomatic   3.  Chronic right scrotal content pain Most likely neuropathic  HPI: Steven Wolfe is a 87 y.o. male presents for annual follow-up.   Doing well since last visit No bothersome LUTS Denies dysuria, gross hematuria Denies flank, abdominal or pelvic pain Stable scrotal pain which is intermittent   PMH: Past Medical History:  Diagnosis Date   Arthritis    Bulging lumbar disc    Cancer (HCC) 02/24/2017   prostate   Chronic sinusitis    CKD (chronic kidney disease), stage III (HCC)    COPD (chronic obstructive pulmonary disease) (HCC)    Coronary artery disease    Degeneration, intervertebral disc, lumbosacral    Diabetes mellitus without complication (HCC)    GERD (gastroesophageal reflux disease)    Hemorrhoids    History of kidney stones    History of kidney stones    Hyperlipemia    Hypertension    Lumbar radiculitis    Lumbar stenosis with neurogenic claudication    Prostate cancer [facility])     Surgical History: Past Surgical History:  Procedure Laterality Date   CARDIAC CATHETERIZATION     CATARACT EXTRACTION W/PHACO Left 03/24/2023   Procedure: CATARACT EXTRACTION PHACO AND INTRAOCULAR LENS PLACEMENT  (IOC) LEFT DIABETIC;  Surgeon: Lockie Mola, MD;  Location: [facility];  Service: Ophthalmology;  Laterality: Left;  19.38 1:37.9   CATARACT EXTRACTION W/PHACO Right 04/07/2023   Procedure: CATARACT EXTRACTION PHACO AND INTRAOCULAR LENS PLACEMENT (IOC) RIGHT DIABETIC;  Surgeon: Lockie Mola, MD;  Location: [facility];  Service: Ophthalmology;  Laterality: Right;  21.02 1:06.6   CHOLECYSTECTOMY  02/24/2017   20 years ago   COLONOSCOPY     COLONOSCOPY WITH PROPOFOL N/A 10/12/2018   Procedure: COLONOSCOPY WITH PROPOFOL;  Surgeon: Scot Jun, MD;  Location: [facility];  Service: Endoscopy;  Laterality: N/A;   FEMUR FRACTURE SURGERY  50 years ago   FUNCTIONAL ENDOSCOPIC SINUS SURGERY     LEG SURGERY Left    ORIF    PROSTATE CRYOABLATION  2007    Home Medications:  Allergies as of 10/06/2023       Reactions   Bee Venom Shortness Of Breath   Difficulty breathing   Lovastatin    Other reaction(s): Abdominal Pain   Chlorthalidone Other (See Comments)   Unsure of reaction   Rosuvastatin    Other reaction(s): Abdominal Pain Patient stated it also made him really nervous         Medication List        Accurate as of October 06, 2023 11:50 AM. If you have any questions, ask your nurse or doctor.          amLODipine 5 MG tablet Commonly known  as: NORVASC Take 5 mg by mouth 2 (two) times daily.   atorvastatin 10 MG tablet Commonly known as: LIPITOR Take 10 mg by mouth daily.   docusate sodium 100 MG capsule Commonly known as: COLACE Take 1 capsule (100 mg total) by mouth 2 (two) times daily.   freestyle lancets 1 each by Other route once daily Dx E11.9   glimepiride 1 MG tablet Commonly known as: AMARYL Take 0.5 mg by mouth daily as needed. For blood sugar greater than 140.   multivitamin with minerals Tabs tablet Take 1 tablet by mouth daily. Multivitamins for Senior 50+   omeprazole 20 MG capsule Commonly known as:  PRILOSEC Take 20 mg by mouth daily.   PRESERVISION AREDS 2 PO Take 1 tablet by mouth daily.   ramipril 10 MG capsule Commonly known as: ALTACE Take 10 mg by mouth 2 (two) times daily.   tetrahydrozoline 0.05 % ophthalmic solution Place 1 drop into both eyes 2 (two) times daily as needed (for scratchy eyes).   traMADol 50 MG tablet Commonly known as: ULTRAM Take 50 mg by mouth 5 (five) times daily as needed. For back pain.        Allergies:  Allergies  Allergen Reactions   Bee Venom Shortness Of Breath    Difficulty breathing    Lovastatin     Other reaction(s): Abdominal Pain   Chlorthalidone Other (See Comments)    Unsure of reaction   Rosuvastatin     Other reaction(s): Abdominal Pain Patient stated it also made him really nervous     Family History: Family History  Problem Relation Age of Onset   Cancer Father    Cancer Mother     Social History:  reports that he quit smoking about 21 years ago. His smoking use included cigarettes. His smokeless tobacco use includes chew. He reports current alcohol use. He reports that he does not use drugs.   Physical Exam: BP 117/67   Pulse 92   Ht 5\' 11"  (1.803 m)   Wt 180 lb (81.6 kg)   BMI 25.10 kg/m   Constitutional:  Alert and oriented, No acute distress. HEENT: [place] AT Respiratory: Normal respiratory effort, no increased work of breathing. Psychiatric: Normal mood and affect.   Assessment & Plan:    1.  Left nephrolithiasis Asymptomatic KUB performed last year was unchanged from a prior KUB of 2020. KUB at annual follow-up next year.   2. Right scrotal content pain Stable   3.  History of prostate cancer Cryoablation 2006   Continue annual follow-up   I have reviewed the above documentation for accuracy and completeness, and I agree with the above.   Riki Altes, MD  [facility] [address] [place], [place] [address] [phone]

## 2023-10-11 DIAGNOSIS — M48062 Spinal stenosis, lumbar region with neurogenic claudication: Secondary | ICD-10-CM | POA: Diagnosis not present

## 2023-10-11 DIAGNOSIS — M5416 Radiculopathy, lumbar region: Secondary | ICD-10-CM | POA: Diagnosis not present

## 2023-10-11 DIAGNOSIS — Z79899 Other long term (current) drug therapy: Secondary | ICD-10-CM | POA: Diagnosis not present

## 2023-10-25 DIAGNOSIS — M5416 Radiculopathy, lumbar region: Secondary | ICD-10-CM | POA: Diagnosis not present

## 2023-10-25 DIAGNOSIS — M48062 Spinal stenosis, lumbar region with neurogenic claudication: Secondary | ICD-10-CM | POA: Diagnosis not present

## 2024-01-17 DIAGNOSIS — M5416 Radiculopathy, lumbar region: Secondary | ICD-10-CM | POA: Diagnosis not present

## 2024-01-17 DIAGNOSIS — Z79899 Other long term (current) drug therapy: Secondary | ICD-10-CM | POA: Diagnosis not present

## 2024-01-17 DIAGNOSIS — M48062 Spinal stenosis, lumbar region with neurogenic claudication: Secondary | ICD-10-CM | POA: Diagnosis not present

## 2024-01-17 DIAGNOSIS — I739 Peripheral vascular disease, unspecified: Secondary | ICD-10-CM | POA: Diagnosis not present

## 2024-01-26 DIAGNOSIS — Z860101 Personal history of adenomatous and serrated colon polyps: Secondary | ICD-10-CM | POA: Diagnosis not present

## 2024-01-26 DIAGNOSIS — K219 Gastro-esophageal reflux disease without esophagitis: Secondary | ICD-10-CM | POA: Diagnosis not present

## 2024-02-04 DIAGNOSIS — M48062 Spinal stenosis, lumbar region with neurogenic claudication: Secondary | ICD-10-CM | POA: Diagnosis not present

## 2024-02-04 DIAGNOSIS — M5416 Radiculopathy, lumbar region: Secondary | ICD-10-CM | POA: Diagnosis not present

## 2024-02-14 DIAGNOSIS — E1122 Type 2 diabetes mellitus with diabetic chronic kidney disease: Secondary | ICD-10-CM | POA: Diagnosis not present

## 2024-02-14 DIAGNOSIS — N183 Chronic kidney disease, stage 3 unspecified: Secondary | ICD-10-CM | POA: Diagnosis not present

## 2024-02-22 DIAGNOSIS — E1122 Type 2 diabetes mellitus with diabetic chronic kidney disease: Secondary | ICD-10-CM | POA: Diagnosis not present

## 2024-02-22 DIAGNOSIS — K219 Gastro-esophageal reflux disease without esophagitis: Secondary | ICD-10-CM | POA: Diagnosis not present

## 2024-02-22 DIAGNOSIS — N183 Chronic kidney disease, stage 3 unspecified: Secondary | ICD-10-CM | POA: Diagnosis not present

## 2024-02-22 DIAGNOSIS — I2510 Atherosclerotic heart disease of native coronary artery without angina pectoris: Secondary | ICD-10-CM | POA: Diagnosis not present

## 2024-02-22 DIAGNOSIS — E782 Mixed hyperlipidemia: Secondary | ICD-10-CM | POA: Diagnosis not present

## 2024-02-22 DIAGNOSIS — Z0000 Encounter for general adult medical examination without abnormal findings: Secondary | ICD-10-CM | POA: Diagnosis not present

## 2024-02-22 DIAGNOSIS — C61 Malignant neoplasm of prostate: Secondary | ICD-10-CM | POA: Diagnosis not present

## 2024-02-22 DIAGNOSIS — I10 Essential (primary) hypertension: Secondary | ICD-10-CM | POA: Diagnosis not present

## 2024-02-22 DIAGNOSIS — J449 Chronic obstructive pulmonary disease, unspecified: Secondary | ICD-10-CM | POA: Diagnosis not present

## 2024-03-16 ENCOUNTER — Other Ambulatory Visit (INDEPENDENT_AMBULATORY_CARE_PROVIDER_SITE_OTHER): Payer: Self-pay | Admitting: Nurse Practitioner

## 2024-03-16 DIAGNOSIS — M79605 Pain in left leg: Secondary | ICD-10-CM

## 2024-03-16 DIAGNOSIS — I739 Peripheral vascular disease, unspecified: Secondary | ICD-10-CM

## 2024-03-17 ENCOUNTER — Ambulatory Visit (INDEPENDENT_AMBULATORY_CARE_PROVIDER_SITE_OTHER)

## 2024-03-17 ENCOUNTER — Encounter (INDEPENDENT_AMBULATORY_CARE_PROVIDER_SITE_OTHER): Payer: Self-pay | Admitting: Nurse Practitioner

## 2024-03-17 ENCOUNTER — Ambulatory Visit (INDEPENDENT_AMBULATORY_CARE_PROVIDER_SITE_OTHER): Admitting: Nurse Practitioner

## 2024-03-17 VITALS — BP 133/69 | HR 75 | Resp 16 | Ht 71.0 in | Wt 184.4 lb

## 2024-03-17 DIAGNOSIS — M79604 Pain in right leg: Secondary | ICD-10-CM

## 2024-03-17 DIAGNOSIS — N183 Chronic kidney disease, stage 3 unspecified: Secondary | ICD-10-CM

## 2024-03-17 DIAGNOSIS — E1122 Type 2 diabetes mellitus with diabetic chronic kidney disease: Secondary | ICD-10-CM

## 2024-03-17 DIAGNOSIS — M79605 Pain in left leg: Secondary | ICD-10-CM

## 2024-03-17 DIAGNOSIS — I739 Peripheral vascular disease, unspecified: Secondary | ICD-10-CM | POA: Diagnosis not present

## 2024-03-17 DIAGNOSIS — I10 Essential (primary) hypertension: Secondary | ICD-10-CM

## 2024-03-20 ENCOUNTER — Encounter (INDEPENDENT_AMBULATORY_CARE_PROVIDER_SITE_OTHER): Payer: Self-pay | Admitting: Nurse Practitioner

## 2024-03-20 LAB — VAS US ABI WITH/WO TBI
Left ABI: 0.95
Right ABI: 1.22

## 2024-03-20 NOTE — Progress Notes (Signed)
 Subjective:    Patient ID: Steven Wolfe, male    DOB: [birth year], 88 y.o.   MRN: [id number] Chief Complaint  Patient presents with   New Patient (Initial Visit)    Ref Chasnis consult ble pain /claudication    The patient is an 88 year old male who returns to [place] today regarding bilateral lower extremity leg pain.  He notes that this pain has been ongoing for several years and that he has been getting treatments done by physiatry.  He has back injections and it improves his symptoms for a few months but nothing last consistently.  Currently takes tramadol and gabapentin for his pain relief but he only takes his gabapentin sporadically, due to the side effects.  He notes that he has pain equally in both lower extremities, neither is worse than the other.  He notes that the gabapentin helps his pain and lessens it, but does not completely eliminate it.  He notes that the pain is constant.  Per the patient, the pain does not improve or worsen with activity.  He notes that in bed at night he also has pain which is improved with activity sometimes.  This does not happen nightly.  Today he has an ABI of 1.22 on the right and 0.95 on the left.  He has strong triphasic waveforms with normal toe waveforms in the right and multiphasic waveforms with slightly dampened toe waveforms on the left.  He previously had an ABI of 1.32 on the right and 1.31 on the left.    Review of Systems  Musculoskeletal:  Positive for arthralgias, back pain and gait problem.  All other systems reviewed and are negative.      Objective:   Physical Exam Vitals reviewed.  HENT:     Head: Normocephalic.  Cardiovascular:     Rate and Rhythm: Normal rate.     Pulses:          Dorsalis pedis pulses are detected w/ Doppler on the right side and detected w/ Doppler on the left side.       Posterior tibial pulses are detected w/ Doppler on the right side and detected w/ Doppler on the left side.  Pulmonary:     Effort:  Pulmonary effort is normal.  Musculoskeletal:     Right lower leg: No edema.     Left lower leg: No edema.  Skin:    General: Skin is warm and dry.  Neurological:     Mental Status: He is alert and oriented to person, place, and time.  Psychiatric:        Mood and Affect: Mood normal.        Behavior: Behavior normal.        Thought Content: Thought content normal.        Judgment: Judgment normal.     BP 133/69   Pulse 75   Resp 16   Ht 5\' 11"  (1.803 m)   Wt 184 lb 6.4 oz (83.6 kg)   BMI 25.72 kg/m   Past Medical History:  Diagnosis Date   Arthritis    Bulging lumbar disc    Cancer (HCC) 02/24/2017   prostate   Chronic sinusitis    CKD (chronic kidney disease), stage III (HCC)    COPD (chronic obstructive pulmonary disease) (HCC)    Coronary artery disease    Degeneration, intervertebral disc, lumbosacral    Diabetes mellitus without complication (HCC)    GERD (gastroesophageal reflux disease)    Hemorrhoids  History of kidney stones    History of kidney stones    Hyperlipemia    Hypertension    Lumbar radiculitis    Lumbar stenosis with neurogenic claudication    Prostate cancer [facility])     Social History   Socioeconomic History   Marital status: Married    Spouse name: Not on file   Number of children: Not on file   Years of education: Not on file   Highest education level: Not on file  Occupational History   Not on file  Tobacco Use   Smoking status: Former    Current packs/day: 0.00    Types: Cigarettes    Quit date: 02/24/2002    Years since quitting: 22.0   Smokeless tobacco: Current    Types: Chew  Vaping Use   Vaping status: Never Used  Substance and Sexual Activity   Alcohol use: Yes    Comment: occasionally   Drug use: No   Sexual activity: Yes  Other Topics Concern   Not on file  Social History Narrative   Not on file   Social Drivers of Health   Financial Resource Strain: Low Risk  (01/26/2024)   Received from [facility]   Overall Financial Resource Strain (CARDIA)    Difficulty of Paying Living Expenses: Not hard at all  Food Insecurity: No Food Insecurity (01/26/2024)   Received from [facility]   Hunger Vital Sign    Worried About Running Out of Food in the Last Year: Never true    Ran Out of Food in the Last Year: Never true  Transportation Needs: No Transportation Needs (01/26/2024)   Received from [facility] - Transportation    In the past 12 months, has lack of transportation kept you from medical appointments or from getting medications?: No    Lack of Transportation (Non-Medical): No  Physical Activity: Not on file  Stress: Not on file  Social Connections: Not on file  Intimate Partner Violence: Not on file    Past Surgical History:  Procedure Laterality Date   CARDIAC CATHETERIZATION     CATARACT EXTRACTION W/PHACO Left 03/24/2023   Procedure: CATARACT EXTRACTION PHACO AND INTRAOCULAR LENS PLACEMENT (IOC) LEFT DIABETIC;  Surgeon: Lockie Mola, MD;  Location: [facility];  Service: Ophthalmology;  Laterality: Left;  19.38 1:37.9   CATARACT EXTRACTION W/PHACO Right 04/07/2023   Procedure: CATARACT EXTRACTION PHACO AND INTRAOCULAR LENS PLACEMENT (IOC) RIGHT DIABETIC;  Surgeon: Lockie Mola, MD;  Location: [facility];  Service: Ophthalmology;  Laterality: Right;  21.02 1:06.6   CHOLECYSTECTOMY  02/24/2017   20 years ago   COLONOSCOPY     COLONOSCOPY WITH PROPOFOL N/A 10/12/2018   Procedure: COLONOSCOPY WITH PROPOFOL;  Surgeon: Scot Jun, MD;  Location: [facility];  Service: Endoscopy;  Laterality: N/A;   FEMUR FRACTURE SURGERY  50 years ago   FUNCTIONAL ENDOSCOPIC SINUS SURGERY     LEG SURGERY Left    ORIF    PROSTATE CRYOABLATION  2007    Family History  Problem Relation Age of Onset   Cancer Father    Cancer Mother     Allergies  Allergen Reactions   Bee Venom Shortness Of  Breath    Difficulty breathing    Lovastatin     Other reaction(s): Abdominal Pain   Chlorthalidone Other (See Comments)    Unsure of reaction   Rosuvastatin     Other reaction(s): Abdominal Pain Patient  stated it also made him really nervous        Latest Ref Rng & Units 03/05/2017    1:24 AM 02/24/2017   10:32 AM  CBC  WBC 4.0 - 10.5 K/uL 11.9  5.9   Hemoglobin 13.0 - 17.0 g/dL 16.1  09.6   Hematocrit 39.0 - 52.0 % 35.0  38.9   Platelets 150 - 400 K/uL 236  251       CMP     Component Value Date/Time   NA 136 03/05/2017 0124   NA 138 10/24/2012 0020   K 4.3 03/05/2017 0124   K 4.2 10/24/2012 0020   CL 100 (L) 03/05/2017 0124   CL 105 10/24/2012 0020   CO2 27 03/05/2017 0124   CO2 22 10/24/2012 0020   GLUCOSE 155 (H) 03/05/2017 0124   GLUCOSE 95 10/24/2012 0020   BUN 19 03/05/2017 0124   BUN 33 (H) 10/24/2012 0020   CREATININE 1.00 02/17/2020 0947   CREATININE 1.26 10/24/2012 0020   CALCIUM 8.7 (L) 03/05/2017 0124   CALCIUM 9.2 10/24/2012 0020   GFRNONAA 53 (L) 03/05/2017 0124   GFRNONAA 56 (L) 10/24/2012 0020     No results found.     Assessment & Plan:   1. PAD (peripheral artery disease) (HCC) (Primary) Today the patient's noninvasive study shows some mild changes from his previous studies in 2019.  Based on this I think it would be beneficial for him to return her to have noninvasive studies annually however at this time currently no intervention is recommended.  2. Pain in both lower extremities Based on the patient's description of pain, I do not feel that is related to his peripheral arterial disease.  His symptoms are atypical for vascular disease.  Based on his studies today he has normal perfusion on the right and slightly abnormal on the left but his symptoms are consistent bilaterally.  In addition, his symptoms are not relieved or exacerbated by activity, which again is atypical for vascular disease.  His pain is constant and typically with  vascular disease with constant pain, significantly decreased ABIs would be noted bilaterally.    I do have suspicion that may be neuropathy.  He has never been evaluated by a neurologist for neuropathy.  I have suggested this to him and he will consider this.  He is advised to contact us back and I can place referral if they would like.  3. Benign essential hypertension Continue antihypertensive medications as already ordered, these medications have been reviewed and there are no changes at this time.  4. Controlled type 2 diabetes mellitus with stage 3 chronic kidney disease, without long-term current use of insulin (HCC) Continue hypoglycemic medications as already ordered, these medications have been reviewed and there are no changes at this time.  Hgb A1C to be monitored as already arranged by primary service   Current Outpatient Medications on File Prior to Visit  Medication Sig Dispense Refill   amLODipine (NORVASC) 5 MG tablet Take 5 mg by mouth 2 (two) times daily.     atorvastatin (LIPITOR) 10 MG tablet Take 10 mg by mouth daily.     docusate sodium (COLACE) 100 MG capsule Take 1 capsule (100 mg total) by mouth 2 (two) times daily. 60 capsule 0   gabapentin (NEURONTIN) 100 MG capsule Take 1 capsule by mouth daily.     glimepiride (AMARYL) 1 MG tablet Take 0.5 mg by mouth daily as needed. For blood sugar greater than 140.  Lancets (FREESTYLE) lancets 1 each by Other route once daily Dx E11.9     Multiple Vitamin (MULTIVITAMIN WITH MINERALS) TABS tablet Take 1 tablet by mouth daily. Multivitamins for Senior 50+     Multiple Vitamins-Minerals (PRESERVISION AREDS 2 PO) Take 1 tablet by mouth daily.     omeprazole (PRILOSEC) 20 MG capsule Take 20 mg by mouth daily.     ramipril (ALTACE) 10 MG capsule Take 10 mg by mouth 2 (two) times daily.     tetrahydrozoline 0.05 % ophthalmic solution Place 1 drop into both eyes 2 (two) times daily as needed (for scratchy eyes).     traMADol  (ULTRAM) 50 MG tablet Take 50 mg by mouth 5 (five) times daily as needed. For back pain.     No current facility-administered medications on file prior to visit.    There are no Patient Instructions on file for this visit. No follow-ups on file.   Georgiana Spinner, NP

## 2024-04-07 DIAGNOSIS — H353132 Nonexudative age-related macular degeneration, bilateral, intermediate dry stage: Secondary | ICD-10-CM | POA: Diagnosis not present

## 2024-04-07 DIAGNOSIS — E119 Type 2 diabetes mellitus without complications: Secondary | ICD-10-CM | POA: Diagnosis not present

## 2024-04-07 DIAGNOSIS — H04123 Dry eye syndrome of bilateral lacrimal glands: Secondary | ICD-10-CM | POA: Diagnosis not present

## 2024-04-07 DIAGNOSIS — Z961 Presence of intraocular lens: Secondary | ICD-10-CM | POA: Diagnosis not present

## 2024-04-10 DIAGNOSIS — B9689 Other specified bacterial agents as the cause of diseases classified elsewhere: Secondary | ICD-10-CM | POA: Diagnosis not present

## 2024-04-10 DIAGNOSIS — J019 Acute sinusitis, unspecified: Secondary | ICD-10-CM | POA: Diagnosis not present

## 2024-04-17 DIAGNOSIS — M48062 Spinal stenosis, lumbar region with neurogenic claudication: Secondary | ICD-10-CM | POA: Diagnosis not present

## 2024-04-17 DIAGNOSIS — Z79899 Other long term (current) drug therapy: Secondary | ICD-10-CM | POA: Diagnosis not present

## 2024-04-17 DIAGNOSIS — I739 Peripheral vascular disease, unspecified: Secondary | ICD-10-CM | POA: Diagnosis not present

## 2024-04-17 DIAGNOSIS — M5416 Radiculopathy, lumbar region: Secondary | ICD-10-CM | POA: Diagnosis not present

## 2024-05-15 DIAGNOSIS — M48062 Spinal stenosis, lumbar region with neurogenic claudication: Secondary | ICD-10-CM | POA: Diagnosis not present

## 2024-05-15 DIAGNOSIS — M5416 Radiculopathy, lumbar region: Secondary | ICD-10-CM | POA: Diagnosis not present

## 2024-06-05 DIAGNOSIS — Z79899 Other long term (current) drug therapy: Secondary | ICD-10-CM | POA: Diagnosis not present

## 2024-06-05 DIAGNOSIS — M5416 Radiculopathy, lumbar region: Secondary | ICD-10-CM | POA: Diagnosis not present

## 2024-06-05 DIAGNOSIS — I739 Peripheral vascular disease, unspecified: Secondary | ICD-10-CM | POA: Diagnosis not present

## 2024-06-05 DIAGNOSIS — M48062 Spinal stenosis, lumbar region with neurogenic claudication: Secondary | ICD-10-CM | POA: Diagnosis not present

## 2024-06-06 ENCOUNTER — Encounter: Payer: Self-pay | Admitting: Dermatology

## 2024-06-06 ENCOUNTER — Ambulatory Visit: Admitting: Dermatology

## 2024-06-06 DIAGNOSIS — L821 Other seborrheic keratosis: Secondary | ICD-10-CM

## 2024-06-06 DIAGNOSIS — D492 Neoplasm of unspecified behavior of bone, soft tissue, and skin: Secondary | ICD-10-CM | POA: Diagnosis not present

## 2024-06-06 DIAGNOSIS — L814 Other melanin hyperpigmentation: Secondary | ICD-10-CM | POA: Diagnosis not present

## 2024-06-06 DIAGNOSIS — Q828 Other specified congenital malformations of skin: Secondary | ICD-10-CM | POA: Diagnosis not present

## 2024-06-06 NOTE — Progress Notes (Signed)
   Follow-Up Visit   Subjective  Steven Wolfe is a 88 y.o. male who presents for the following: Spot on left ankle. Has not been changing. Itches off and on. Uses TMC cream as needed for itching. It bothers him and his wife is concerned as well.  The patient has spots, moles and lesions to be evaluated, some may be new or changing and the patient may have concern these could be cancer.    The following portions of the chart were reviewed this encounter and updated as appropriate: medications, allergies, medical history  Review of Systems:  No other skin or systemic complaints except as noted in HPI or Assessment and Plan.  Objective  Well appearing patient in no apparent distress; mood and affect are within normal limits.  All skin waist up examined. Relevant physical exam findings are noted in the Assessment and Plan.  L lat ankle (2) 1.5 cm pink plaque with keratotic rim  Right lateral calf 7 mm pink thin papule with keratotic rim   Assessment & Plan   NEOPLASM OF SKIN (3) L lat ankle (2) Skin / nail biopsy Type of biopsy: tangential   Informed consent: discussed and consent obtained   Anesthesia: the lesion was anesthetized in a standard fashion   Anesthesia comment:  Area prepped with alcohol  Anesthetic:  1% lidocaine  w/ epinephrine  1-100,000 buffered w/ 8.4% NaHCO3 Instrument used: flexible razor blade   Hemostasis achieved with: pressure, aluminum chloride and electrodesiccation   Outcome: patient tolerated procedure well   Post-procedure details: wound care instructions given   Post-procedure details comment:  Ointment and small bandage applied Specimen 1 - Surgical pathology Differential Diagnosis: Irritated Porokeratosis vs ISK, R/O SCCis  Check Margins: No Right lateral calf Epidermal / dermal shaving  Lesion diameter (cm):  0.7 Informed consent: discussed and consent obtained   Patient was prepped and draped in usual sterile fashion: Area prepped with  alcohol . Anesthesia: the lesion was anesthetized in a standard fashion   Anesthetic:  1% lidocaine  w/ epinephrine  1-100,000 buffered w/ 8.4% NaHCO3 Instrument used: flexible razor blade   Hemostasis achieved with: pressure, aluminum chloride and electrodesiccation   Outcome: patient tolerated procedure well   Post-procedure details: wound care instructions given   Post-procedure details comment:  Ointment and small bandage applied Specimen 2 - Surgical pathology Differential Diagnosis: Irritated Dermatofibroma vs porokeratosis, R/O BCC/SCC  Check Margins: No   LENTIGINES Exam: scattered tan macules Due to sun exposure Treatment Plan: Benign-appearing, observe. Recommend daily broad spectrum sunscreen SPF 30+ to sun-exposed areas, reapply every 2 hours as needed.  Call for any changes  SEBORRHEIC KERATOSIS - Stuck-on, waxy, tan-brown papules and/or plaques  - Benign-appearing - Discussed benign etiology and prognosis. - Observe - Call for any changes    Return for TBSE As Scheduled, With Dr. Jackquline.  I, Jill Parcell, CMA, am acting as scribe for Rexene Jackquline, MD.   Documentation: I have reviewed the above documentation for accuracy and completeness, and I agree with the above.  Rexene Jackquline, MD

## 2024-06-06 NOTE — Progress Notes (Deleted)
   Follow-Up Visit   Subjective  Steven Wolfe is a 88 y.o. male who presents for the following: Spot on left ankle. States it has not changed over the past 10 months or so. Does use the Triamcinolone cream when itching. Itching comes and goes but lesion never completely goes away.   The patient has spots, moles and lesions to be evaluated, some may be new or changing and the patient may have concern these could be cancer.    The following portions of the chart were reviewed this encounter and updated as appropriate: medications, allergies, medical history  Review of Systems:  No other skin or systemic complaints except as noted in HPI or Assessment and Plan.  Objective  Well appearing patient in no apparent distress; mood and affect are within normal limits.  A focused examination was performed of the following areas: Left leg/ankle  Relevant physical exam findings are noted in the Assessment and Plan.  L lat ankle (2)  pink patch with keratotic rim  Assessment & Plan   POROKERATOSIS (2) L lat ankle (2) Benign-appearing.  Observation.  Call clinic for new or changing lesions.  Recommend daily use of broad spectrum spf 30+ sunscreen to sun-exposed areas.   Continue TMC 0.1% cream QD-BID PRN itch.     Return for TBSE As Scheduled, With Dr. Jackquline.  I, Brainard Highfill, CMA, am acting as scribe for Rexene Jackquline, MD.   Documentation: I have reviewed the above documentation for accuracy and completeness, and I agree with the above.  Rexene Jackquline, MD

## 2024-06-06 NOTE — Patient Instructions (Addendum)
 Wound Care Instructions  Do Not Use Triamcinolone cream on biopsy sites  Cleanse wound gently with soap and water once a day then pat dry with clean gauze. Apply a thin coat of Petrolatum (petroleum jelly, Vaseline) over the wound (unless you have an allergy to this). We recommend that you use a new, sterile tube of Vaseline. Do not pick or remove scabs. Do not remove the yellow or white healing tissue from the base of the wound.  Cover the wound with fresh, clean, nonstick gauze and secure with paper tape. You may use Band-Aids in place of gauze and tape if the wound is small enough, but would recommend trimming much of the tape off as there is often too much. Sometimes Band-Aids can irritate the skin.  You should call the office for your biopsy report after 1 week if you have not already been contacted.  If you experience any problems, such as abnormal amounts of bleeding, swelling, significant bruising, significant pain, or evidence of infection, please call the office immediately.  FOR ADULT SURGERY PATIENTS: If you need something for pain relief you may take 1 extra strength Tylenol  (acetaminophen ) AND 2 Ibuprofen (200mg  each) together every 4 hours as needed for pain. (do not take these if you are allergic to them or if you have a reason you should not take them.) Typically, you may only need pain medication for 1 to 3 days.      Recommend daily broad spectrum sunscreen SPF 30+ to sun-exposed areas, reapply every 2 hours as needed. Call for new or changing lesions.  Staying in the shade or wearing long sleeves, sun glasses (UVA+UVB protection) and wide brim hats (4-inch brim around the entire circumference of the hat) are also recommended for sun protection.      Due to recent changes in healthcare laws, you may see results of your pathology and/or laboratory studies on MyChart before the doctors have had a chance to review them. We understand that in some cases there may be results  that are confusing or concerning to you. Please understand that not all results are received at the same time and often the doctors may need to interpret multiple results in order to provide you with the best plan of care or course of treatment. Therefore, we ask that you please give us  2 business days to thoroughly review all your results before contacting the office for clarification. Should we see a critical lab result, you will be contacted sooner.   If You Need Anything After Your Visit  If you have any questions or concerns for your doctor, please call our main line at [phone] and press option 4 to reach your doctor's medical assistant. If no one answers, please leave a voicemail as directed and we will return your call as soon as possible. Messages left after 4 pm will be answered the following business day.   You may also send us  a message via MyChart. We typically respond to MyChart messages within 1-2 business days.  For prescription refills, please ask your pharmacy to contact our office. Our fax number is [phone].  If you have an urgent issue when the clinic is closed that cannot wait until the next business day, you can page your doctor at the number below.    Please note that while we do our best to be available for urgent issues outside of office hours, we are not available 24/7.   If you have an urgent issue and are unable to  reach us , you may choose to seek medical care at your doctor's office, retail clinic, urgent care center, or emergency room.  If you have a medical emergency, please immediately call 911 or go to the emergency department.  Pager Numbers  - Dr. Hester: [phone]  - Dr. Jackquline: [phone]  - Dr. Claudene: [phone]   In the event of inclement weather, please call our main line at [phone] for an update on the status of any delays or closures.  Dermatology Medication Tips: Please keep the boxes that topical medications come in in  order to help keep track of the instructions about where and how to use these. Pharmacies typically print the medication instructions only on the boxes and not directly on the medication tubes.   If your medication is too expensive, please contact our office at [phone] option 4 or send us  a message through MyChart.   We are unable to tell what your co-pay for medications will be in advance as this is different depending on your insurance coverage. However, we may be able to find a substitute medication at lower cost or fill out paperwork to get insurance to cover a needed medication.   If a prior authorization is required to get your medication covered by your insurance company, please allow us  1-2 business days to complete this process.  Drug prices often vary depending on where the prescription is filled and some pharmacies may offer cheaper prices.  The website www.goodrx.com contains coupons for medications through different pharmacies. The prices here do not account for what the cost may be with help from insurance (it may be cheaper with your insurance), but the website can give you the price if you did not use any insurance.  - You can print the associated coupon and take it with your prescription to the pharmacy.  - You may also stop by our office during regular business hours and pick up a GoodRx coupon card.  - If you need your prescription sent electronically to a different pharmacy, notify our office through [facility] or by phone at [phone] option 4.     Si Usted Necesita Algo Despus de Su Visita  Tambin puede enviarnos un mensaje a travs de Clinical cytogeneticist. Por lo general respondemos a los mensajes de MyChart en el transcurso de 1 a 2 das hbiles.  Para renovar recetas, por favor pida a su farmacia que se ponga en contacto con nuestra oficina. Randi lakes de fax es [place] [phone].  Si tiene un asunto urgente cuando la clnica est cerrada y que no puede  esperar hasta el siguiente da hbil, puede llamar/localizar a su doctor(a) al nmero que aparece a continuacin.   Por favor, tenga en cuenta que aunque hacemos todo lo posible para estar disponibles para asuntos urgentes fuera del horario de [place], no estamos disponibles las 24 horas del da, los 7 [address]  [address] de la [place].   Si tiene un problema urgente y no puede comunicarse con nosotros, puede optar por buscar atencin mdica  en el consultorio de su doctor(a), en una clnica privada, en un centro de atencin urgente o en una sala de emergencias.  Si tiene Engineer, drilling, por favor llame inmediatamente al 911 o vaya a la sala de emergencias.  Nmeros de bper  - Dr. Hester: [phone]  - Dra. Jackquline: [phone]  - Dr. Claudene: [phone]   En caso de inclemencias del tiempo, por favor llame a landry capes principal al [phone] para ignacia actualizacin sobre el [place]  de cualquier retraso o cierre.  Consejos para la medicacin en dermatologa: Por favor, guarde las cajas en las que vienen los medicamentos de uso tpico para ayudarle a seguir las instrucciones sobre dnde y cmo usarlos. Las farmacias generalmente imprimen las instrucciones del medicamento slo en las cajas y no directamente en los tubos del [place].   Si su medicamento es muy caro, por favor, pngase en contacto con landry rieger llamando al [phone] y presione la opcin 4 o envenos un mensaje a travs de Clinical cytogeneticist.   No podemos decirle cul ser su copago por los medicamentos por adelantado ya que esto es diferente dependiendo de la cobertura de su seguro. Sin embargo, es posible que podamos encontrar un medicamento sustituto a Audiological scientist un formulario para que el seguro cubra el medicamento que se considera necesario.   Si se requiere una autorizacin previa para que su compaa de seguros malta su medicamento, por favor permtanos de 1 a 2 das hbiles para completar este proceso.  Los  precios de los medicamentos varan con frecuencia dependiendo del Environmental consultant de dnde se surte la receta y alguna farmacias pueden ofrecer precios ms baratos.  El sitio web www.goodrx.com tiene cupones para medicamentos de Health and safety inspector. Los precios aqu no tienen en cuenta lo que podra costar con la ayuda del seguro (puede ser ms barato con su seguro), pero el sitio web puede darle el precio si no utiliz Tourist information centre manager.  - Puede imprimir el cupn correspondiente y llevarlo con su receta a la farmacia.  - Tambin puede pasar por nuestra oficina durante el horario de atencin regular y Education officer, museum una tarjeta de cupones de GoodRx.  - Si necesita que su receta se enve electrnicamente a una farmacia diferente, informe a nuestra oficina a travs de MyChart de [place] o por telfono llamando al [phone] y presione la opcin 4.

## 2024-06-08 LAB — SURGICAL PATHOLOGY

## 2024-06-12 ENCOUNTER — Ambulatory Visit: Payer: Self-pay | Admitting: Dermatology

## 2024-06-12 NOTE — Telephone Encounter (Signed)
 Left message for patient to call back for biopsy results.

## 2024-06-12 NOTE — Telephone Encounter (Signed)
-----   Message from Rexene Rattler sent at 06/12/2024  8:29 AM EDT ----- 1. Skin, L lat ankle :       SUPERFICIAL ACTINIC POROKERATOSIS  2. Skin, right lateral calf :       SUPERFICIAL ACTINIC POROKERATOSIS  Both biopsies showed benign porokeratosis, can continue to observe, or may consider cryotherapy to remove residual lesion if pt desires  - please call patient ----- Message ----- From: Interface, Lab In [place] Sent: 06/08/2024   7:39 PM EDT To: Rexene Rattler, MD

## 2024-06-13 NOTE — Telephone Encounter (Signed)
 Left pt another message to call for bx results./sh

## 2024-06-13 NOTE — Telephone Encounter (Signed)
 Patient advised of BX results and will keep follow up in August. aw

## 2024-06-13 NOTE — Telephone Encounter (Signed)
-----   Message from Rexene Rattler sent at 06/12/2024  8:29 AM EDT ----- 1. Skin, L lat ankle :       SUPERFICIAL ACTINIC POROKERATOSIS  2. Skin, right lateral calf :       SUPERFICIAL ACTINIC POROKERATOSIS  Both biopsies showed benign porokeratosis, can continue to observe, or may consider cryotherapy to remove residual lesion if pt desires  - please call patient ----- Message ----- From: Interface, Lab In [place] Sent: 06/08/2024   7:39 PM EDT To: Rexene Rattler, MD

## 2024-06-29 DIAGNOSIS — R6883 Chills (without fever): Secondary | ICD-10-CM | POA: Diagnosis not present

## 2024-07-07 ENCOUNTER — Encounter: Payer: Self-pay | Admitting: Urology

## 2024-08-07 ENCOUNTER — Ambulatory Visit: Payer: PPO | Admitting: Dermatology

## 2024-08-07 ENCOUNTER — Encounter: Payer: Self-pay | Admitting: Dermatology

## 2024-08-07 DIAGNOSIS — L817 Pigmented purpuric dermatosis: Secondary | ICD-10-CM

## 2024-08-07 DIAGNOSIS — L814 Other melanin hyperpigmentation: Secondary | ICD-10-CM | POA: Diagnosis not present

## 2024-08-07 DIAGNOSIS — Q828 Other specified congenital malformations of skin: Secondary | ICD-10-CM

## 2024-08-07 DIAGNOSIS — D1801 Hemangioma of skin and subcutaneous tissue: Secondary | ICD-10-CM

## 2024-08-07 DIAGNOSIS — D229 Melanocytic nevi, unspecified: Secondary | ICD-10-CM

## 2024-08-07 DIAGNOSIS — W908XXA Exposure to other nonionizing radiation, initial encounter: Secondary | ICD-10-CM

## 2024-08-07 DIAGNOSIS — L820 Inflamed seborrheic keratosis: Secondary | ICD-10-CM | POA: Diagnosis not present

## 2024-08-07 DIAGNOSIS — L578 Other skin changes due to chronic exposure to nonionizing radiation: Secondary | ICD-10-CM | POA: Diagnosis not present

## 2024-08-07 DIAGNOSIS — L821 Other seborrheic keratosis: Secondary | ICD-10-CM

## 2024-08-07 DIAGNOSIS — Z1283 Encounter for screening for malignant neoplasm of skin: Secondary | ICD-10-CM

## 2024-08-07 DIAGNOSIS — I781 Nevus, non-neoplastic: Secondary | ICD-10-CM

## 2024-08-07 NOTE — Progress Notes (Signed)
 Follow-Up Visit   Subjective  Steven Wolfe is a 88 y.o. male who presents for the following: Skin Cancer Screening and Upper Body Skin Exam, hx of porokeratosis L lat ankle, R lat calf.  Spots on face and arm that he picks at.  The patient presents for Upper Body Skin Exam (UBSE) for skin cancer screening and mole check. The patient has spots, moles and lesions to be evaluated, some may be new or changing and the patient may have concern these could be cancer.    The following portions of the chart were reviewed this encounter and updated as appropriate: medications, allergies, medical history  Review of Systems:  No other skin or systemic complaints except as noted in HPI or Assessment and Plan.  Objective  Well appearing patient in no apparent distress; mood and affect are within normal limits.  All skin waist up examined. Relevant physical exam findings are noted in the Assessment and Plan.  L forearm x 1, R frontal hairline x 1 (2) Stuck on waxy paps with erythema R forearm, residual stuck on waxy pap with erythema R frontal hairline  Assessment & Plan   INFLAMED SEBORRHEIC KERATOSIS (2) L forearm x 1, R frontal hairline x 1 (2) Symptomatic, irritating, patient would like treated. Destruction of lesion - L forearm x 1, R frontal hairline x 1 (2)  Destruction method: cryotherapy   Informed consent: discussed and consent obtained   Lesion destroyed using liquid nitrogen: Yes   Region frozen until ice ball extended beyond lesion: Yes   Outcome: patient tolerated procedure well with no complications   Post-procedure details: wound care instructions given   Additional details:  Prior to procedure, discussed risks of blister formation, small wound, skin dyspigmentation, or rare scar following cryotherapy. Recommend Vaseline ointment to treated areas while healing.   Skin cancer screening performed today.  Actinic Damage - Chronic condition, secondary to cumulative UV/sun  exposure - diffuse scaly erythematous macules with underlying dyspigmentation - Recommend daily broad spectrum sunscreen SPF 30+ to sun-exposed areas, reapply every 2 hours as needed.  - Staying in the shade or wearing long sleeves, sun glasses (UVA+UVB protection) and wide brim hats (4-inch brim around the entire circumference of the hat) are also recommended for sun protection.  - Call for new or changing lesions.  Lentigines, Seborrheic Keratoses, Hemangiomas - Benign normal skin lesions - Benign-appearing - Call for any changes  Melanocytic Nevi - Tan-brown and/or pink-flesh-colored symmetric macules and papules - Benign appearing on exam today - Observation - Call clinic for new or changing moles - Recommend daily use of broad spectrum spf 30+ sunscreen to sun-exposed areas.   POROKERATOSIS Bx proven 06/06/24 L lat ankle, R lat calf Exam: well healed bx sites  Treatment Plan: Discussed LN2, pt declines Benign, observe  SCHAMBERG'S PIGMENTED PURPURA Bil lower legs Exam: cayenne-pepper-like macules with associated golden-brown pigmentation of lower legs.  + lower leg edema  Schamberg disease is a benign chronic condition that may also have intermittent episodes of worsening/improvement.  It is the most common type of pigmented purpura and is typically asymptomatic.  It generally affects older individuals, and may be related to leg swelling, increased activity, or excessive alcohol  use.  Sometimes short term treatment with a mid-potency topical steroid is given for acute flares.  Treatment Plan: Benign, observe.   Recommend daily graduated compression hose/stockings- easiest to put on first thing in morning, remove at bedtime.   SEBORRHEIC KERATOSIS R ear helix Exam: 7.0 x  4.66mm speckled brown waxy macule  Treatment: Benign, observe.    TELANGIECTASIA R nasal tip Exam: pink blanching macule c/w dilated blood vessel(s)  Treatment Plan: Benign appearing on exam Call  for changes   Return in about 1 year (around 08/07/2025) for UBSE.  I, Grayce Saunas, RMA, am acting as scribe for Rexene Rattler, MD .   Documentation: I have reviewed the above documentation for accuracy and completeness, and I agree with the above.  Rexene Rattler, MD

## 2024-08-07 NOTE — Patient Instructions (Addendum)

## 2024-08-25 DIAGNOSIS — E1122 Type 2 diabetes mellitus with diabetic chronic kidney disease: Secondary | ICD-10-CM | POA: Diagnosis not present

## 2024-08-25 DIAGNOSIS — N183 Chronic kidney disease, stage 3 unspecified: Secondary | ICD-10-CM | POA: Diagnosis not present

## 2024-09-01 DIAGNOSIS — E782 Mixed hyperlipidemia: Secondary | ICD-10-CM | POA: Diagnosis not present

## 2024-09-01 DIAGNOSIS — I2510 Atherosclerotic heart disease of native coronary artery without angina pectoris: Secondary | ICD-10-CM | POA: Diagnosis not present

## 2024-09-01 DIAGNOSIS — J449 Chronic obstructive pulmonary disease, unspecified: Secondary | ICD-10-CM | POA: Diagnosis not present

## 2024-09-01 DIAGNOSIS — K219 Gastro-esophageal reflux disease without esophagitis: Secondary | ICD-10-CM | POA: Diagnosis not present

## 2024-09-01 DIAGNOSIS — Z0001 Encounter for general adult medical examination with abnormal findings: Secondary | ICD-10-CM | POA: Diagnosis not present

## 2024-09-01 DIAGNOSIS — N183 Chronic kidney disease, stage 3 unspecified: Secondary | ICD-10-CM | POA: Diagnosis not present

## 2024-09-01 DIAGNOSIS — Z1331 Encounter for screening for depression: Secondary | ICD-10-CM | POA: Diagnosis not present

## 2024-09-01 DIAGNOSIS — E1122 Type 2 diabetes mellitus with diabetic chronic kidney disease: Secondary | ICD-10-CM | POA: Diagnosis not present

## 2024-09-01 DIAGNOSIS — I10 Essential (primary) hypertension: Secondary | ICD-10-CM | POA: Diagnosis not present

## 2024-09-14 DIAGNOSIS — M48062 Spinal stenosis, lumbar region with neurogenic claudication: Secondary | ICD-10-CM | POA: Diagnosis not present

## 2024-09-14 DIAGNOSIS — Z79899 Other long term (current) drug therapy: Secondary | ICD-10-CM | POA: Diagnosis not present

## 2024-09-14 DIAGNOSIS — M5416 Radiculopathy, lumbar region: Secondary | ICD-10-CM | POA: Diagnosis not present

## 2024-09-19 DIAGNOSIS — M5417 Radiculopathy, lumbosacral region: Secondary | ICD-10-CM | POA: Diagnosis not present

## 2024-09-21 ENCOUNTER — Other Ambulatory Visit: Payer: Self-pay | Admitting: *Deleted

## 2024-09-21 DIAGNOSIS — N200 Calculus of kidney: Secondary | ICD-10-CM

## 2024-09-27 DIAGNOSIS — M5416 Radiculopathy, lumbar region: Secondary | ICD-10-CM | POA: Diagnosis not present

## 2024-09-27 DIAGNOSIS — M48062 Spinal stenosis, lumbar region with neurogenic claudication: Secondary | ICD-10-CM | POA: Diagnosis not present

## 2024-10-02 ENCOUNTER — Ambulatory Visit
Admission: RE | Admit: 2024-10-02 | Discharge: 2024-10-02 | Disposition: A | Discharge status: Home / Self Care | Source: Ambulatory Visit | Attending: Urology | Admitting: Urology

## 2024-10-02 DIAGNOSIS — N200 Calculus of kidney: Secondary | ICD-10-CM

## 2024-10-03 ENCOUNTER — Ambulatory Visit: Admitting: Urology

## 2024-10-03 ENCOUNTER — Encounter: Payer: Self-pay | Admitting: Urology

## 2024-10-03 VITALS — BP 139/75 | HR 52 | Ht 71.0 in | Wt 180.0 lb

## 2024-10-03 DIAGNOSIS — N200 Calculus of kidney: Secondary | ICD-10-CM | POA: Diagnosis not present

## 2024-10-03 DIAGNOSIS — Z8546 Personal history of malignant neoplasm of prostate: Secondary | ICD-10-CM

## 2024-10-03 NOTE — Progress Notes (Signed)
 10/03/2024 8:05 AM   Steven Wolfe 1936/07/12 [id number]  Referring provider: Rudolpho Norleen BIRCH, MD [address] [facility] [place],  [place] [address]  Chief Complaint  Patient presents with   Nephrolithiasis   Urologic history: 1.  Prostate cancer Prostate cryoablation November 2006; Gleason score and PSA at time of diagnosis not available on record review Elected to discontinue PSA testing in 2021 (PSA stable 1.1)   2.  Nephrolithiasis CT 10/2021 with 19 mm nonobstructing left upper pole calculus-asymptomatic   3.  Chronic right scrotal content pain Most likely neuropathic Scrotal ultrasound 09/18/2021 without significant abnormality  HPI: Steven Wolfe is a 88 y.o. male presents for annual follow-up  Only complaint is chronic lumbosacral low back pain for the last 6 months Has been treated with support and epidural steroid injections No bothersome LUTS No dysuria or gross hematuria   PMH: Past Medical History:  Diagnosis Date   Arthritis    Bulging lumbar disc    Cancer (HCC) 02/24/2017   prostate   Chronic sinusitis    CKD (chronic kidney disease), stage III (HCC)    COPD (chronic obstructive pulmonary disease) (HCC)    Coronary artery disease    Degeneration, intervertebral disc, lumbosacral    Diabetes mellitus without complication (HCC)    GERD (gastroesophageal reflux disease)    Hemorrhoids    History of kidney stones    History of kidney stones    Hyperlipemia    Hypertension    Lumbar radiculitis    Lumbar stenosis with neurogenic claudication    Prostate cancer [facility])     Surgical History: Past Surgical History:  Procedure Laterality Date   CARDIAC CATHETERIZATION     CATARACT EXTRACTION W/PHACO Left 03/24/2023   Procedure: CATARACT EXTRACTION PHACO AND INTRAOCULAR LENS PLACEMENT (IOC) LEFT DIABETIC;  Surgeon: Mittie Gaskin, MD;  Location: [facility];  Service: Ophthalmology;  Laterality: Left;  19.38 1:37.9    CATARACT EXTRACTION W/PHACO Right 04/07/2023   Procedure: CATARACT EXTRACTION PHACO AND INTRAOCULAR LENS PLACEMENT (IOC) RIGHT DIABETIC;  Surgeon: Mittie Gaskin, MD;  Location: [facility];  Service: Ophthalmology;  Laterality: Right;  21.02 1:06.6   CHOLECYSTECTOMY  02/24/2017   20 years ago   COLONOSCOPY     COLONOSCOPY WITH PROPOFOL  N/A 10/12/2018   Procedure: COLONOSCOPY WITH PROPOFOL ;  Surgeon: Viktoria Lamar DASEN, MD;  Location: [facility];  Service: Endoscopy;  Laterality: N/A;   FEMUR FRACTURE SURGERY  50 years ago   FUNCTIONAL ENDOSCOPIC SINUS SURGERY     LEG SURGERY Left    ORIF    PROSTATE CRYOABLATION  2007    Home Medications:  Allergies as of 10/03/2024       Reactions   Bee Venom Shortness Of Breath   Difficulty breathing   Lovastatin    Other reaction(s): Abdominal Pain   Chlorthalidone Other (See Comments)   Unsure of reaction   Rosuvastatin    Other reaction(s): Abdominal Pain Patient stated it also made him really nervous         Medication List        Accurate as of October 03, 2024  8:05 AM. If you have any questions, ask your nurse or doctor.          amLODipine  5 MG tablet Commonly known as: NORVASC  Take 5 mg by mouth 2 (two) times daily.   atorvastatin 10 MG tablet Commonly known as: LIPITOR Take 10 mg by mouth daily.   docusate sodium  100 MG  capsule Commonly known as: COLACE Take 1 capsule (100 mg total) by mouth 2 (two) times daily.   freestyle lancets 1 each by Other route once daily Dx E11.9   gabapentin 100 MG capsule Commonly known as: NEURONTIN Take 1 capsule by mouth daily.   glimepiride  1 MG tablet Commonly known as: AMARYL  Take 0.5 mg by mouth daily as needed. For blood sugar greater than 140.   multivitamin with minerals Tabs tablet Take 1 tablet by mouth daily. Multivitamins for Senior 50+   omeprazole 20 MG capsule Commonly known as: PRILOSEC Take 20 mg by mouth daily.   PRESERVISION AREDS 2  PO Take 1 tablet by mouth daily.   ramipril  10 MG capsule Commonly known as: ALTACE  Take 10 mg by mouth 2 (two) times daily.   tetrahydrozoline 0.05 % ophthalmic solution Place 1 drop into both eyes 2 (two) times daily as needed (for scratchy eyes).   traMADol  50 MG tablet Commonly known as: ULTRAM  Take 50 mg by mouth 5 (five) times daily as needed. For back pain.        Allergies:  Allergies  Allergen Reactions   Bee Venom Shortness Of Breath    Difficulty breathing    Lovastatin     Other reaction(s): Abdominal Pain   Chlorthalidone Other (See Comments)    Unsure of reaction   Rosuvastatin     Other reaction(s): Abdominal Pain Patient stated it also made him really nervous     Family History: Family History  Problem Relation Age of Onset   Cancer Father    Cancer Mother     Social History:  reports that he quit smoking about 22 years ago. His smoking use included cigarettes. His smokeless tobacco use includes chew. He reports current alcohol  use. He reports that he does not use drugs.   Physical Exam: BP 139/75   Pulse (!) 52   Ht 5' 11 (1.803 m)   Wt 180 lb (81.6 kg)   BMI 25.10 kg/m   Constitutional:  Alert, No acute distress. HEENT: [place] AT Respiratory: Normal respiratory effort, no increased work of breathing. Psychiatric: Normal mood and affect.   Pertinent Imaging: KUB performed prior to appointment was personally reviewed and interpreted.  No significant change in the left upper pole renal calculus   Assessment & Plan:    1.  Left nephrolithiasis Asymptomatic Stable on KUB 2-year follow-up with KUB   2.  History of prostate cancer Cryoablation 2006   Steven JAYSON Barba, MD  [facility] [address], [address] [place], [place] [address] [phone]

## 2024-10-05 ENCOUNTER — Ambulatory Visit: Payer: Self-pay | Admitting: Urology

## 2024-10-09 DIAGNOSIS — Z961 Presence of intraocular lens: Secondary | ICD-10-CM | POA: Diagnosis not present

## 2024-10-09 DIAGNOSIS — H43813 Vitreous degeneration, bilateral: Secondary | ICD-10-CM | POA: Diagnosis not present

## 2024-10-09 DIAGNOSIS — H26491 Other secondary cataract, right eye: Secondary | ICD-10-CM | POA: Diagnosis not present

## 2024-10-09 DIAGNOSIS — H353132 Nonexudative age-related macular degeneration, bilateral, intermediate dry stage: Secondary | ICD-10-CM | POA: Diagnosis not present

## 2024-10-19 DIAGNOSIS — Z79899 Other long term (current) drug therapy: Secondary | ICD-10-CM | POA: Diagnosis not present

## 2024-10-19 DIAGNOSIS — M48062 Spinal stenosis, lumbar region with neurogenic claudication: Secondary | ICD-10-CM | POA: Diagnosis not present

## 2024-10-19 DIAGNOSIS — M5416 Radiculopathy, lumbar region: Secondary | ICD-10-CM | POA: Diagnosis not present

## 2024-10-20 ENCOUNTER — Other Ambulatory Visit: Payer: Self-pay | Admitting: Physical Medicine and Rehabilitation

## 2024-10-20 DIAGNOSIS — M5416 Radiculopathy, lumbar region: Secondary | ICD-10-CM

## 2024-10-26 ENCOUNTER — Ambulatory Visit
Admission: RE | Admit: 2024-10-26 | Discharge: 2024-10-26 | Disposition: A | Discharge status: Home / Self Care | Source: Ambulatory Visit | Attending: Physical Medicine and Rehabilitation | Admitting: Physical Medicine and Rehabilitation

## 2024-10-26 DIAGNOSIS — M47816 Spondylosis without myelopathy or radiculopathy, lumbar region: Secondary | ICD-10-CM | POA: Diagnosis not present

## 2024-10-26 DIAGNOSIS — M5126 Other intervertebral disc displacement, lumbar region: Secondary | ICD-10-CM | POA: Diagnosis not present

## 2024-10-26 DIAGNOSIS — M4316 Spondylolisthesis, lumbar region: Secondary | ICD-10-CM | POA: Diagnosis not present

## 2024-10-26 DIAGNOSIS — M5416 Radiculopathy, lumbar region: Secondary | ICD-10-CM

## 2025-03-16 ENCOUNTER — Encounter (INDEPENDENT_AMBULATORY_CARE_PROVIDER_SITE_OTHER)

## 2025-03-16 ENCOUNTER — Ambulatory Visit (INDEPENDENT_AMBULATORY_CARE_PROVIDER_SITE_OTHER): Admitting: Nurse Practitioner

## 2025-08-13 ENCOUNTER — Ambulatory Visit: Admitting: Dermatology
